# Patient Record
Sex: Female | Born: 1960 | Race: Black or African American | Hispanic: No | Marital: Single | Smoking: Never smoker
Health system: Southern US, Community
[De-identification: ages and names within clinical notes are randomized; demographics above are authoritative.]

## PROBLEM LIST (undated history)

## (undated) DIAGNOSIS — Z8489 Family history of other specified conditions: Secondary | ICD-10-CM

## (undated) DIAGNOSIS — I219 Acute myocardial infarction, unspecified: Secondary | ICD-10-CM

## (undated) DIAGNOSIS — I10 Essential (primary) hypertension: Secondary | ICD-10-CM

## (undated) DIAGNOSIS — I2510 Atherosclerotic heart disease of native coronary artery without angina pectoris: Secondary | ICD-10-CM

## (undated) DIAGNOSIS — E669 Obesity, unspecified: Secondary | ICD-10-CM

## (undated) DIAGNOSIS — I82409 Acute embolism and thrombosis of unspecified deep veins of unspecified lower extremity: Secondary | ICD-10-CM

## (undated) DIAGNOSIS — Z9289 Personal history of other medical treatment: Secondary | ICD-10-CM

## (undated) HISTORY — PX: TOTAL KNEE ARTHROPLASTY: SHX125

## (undated) HISTORY — PX: JOINT REPLACEMENT: SHX530

## (undated) HISTORY — PX: HERNIA REPAIR: SHX51

## (undated) HISTORY — PX: KNEE ARTHROSCOPY: SHX127

---

## 1980-09-19 HISTORY — PX: TUBAL LIGATION: SHX77

## 2014-09-19 DIAGNOSIS — I219 Acute myocardial infarction, unspecified: Secondary | ICD-10-CM

## 2014-09-19 HISTORY — PX: TOTAL ABDOMINAL HYSTERECTOMY: SHX209

## 2014-09-19 HISTORY — PX: CORONARY ANGIOPLASTY WITH STENT PLACEMENT: SHX49

## 2014-09-19 HISTORY — DX: Acute myocardial infarction, unspecified: I21.9

## 2016-12-29 ENCOUNTER — Emergency Department (HOSPITAL_BASED_OUTPATIENT_CLINIC_OR_DEPARTMENT_OTHER)
Admission: EM | Admit: 2016-12-29 | Discharge: 2016-12-29 | Disposition: A | Discharge status: Home / Self Care | Payer: 59 | Attending: Emergency Medicine | Admitting: Emergency Medicine

## 2016-12-29 ENCOUNTER — Encounter (HOSPITAL_BASED_OUTPATIENT_CLINIC_OR_DEPARTMENT_OTHER): Payer: Self-pay | Admitting: *Deleted

## 2016-12-29 DIAGNOSIS — I2510 Atherosclerotic heart disease of native coronary artery without angina pectoris: Secondary | ICD-10-CM | POA: Diagnosis not present

## 2016-12-29 DIAGNOSIS — R238 Other skin changes: Secondary | ICD-10-CM | POA: Diagnosis not present

## 2016-12-29 DIAGNOSIS — I10 Essential (primary) hypertension: Secondary | ICD-10-CM | POA: Insufficient documentation

## 2016-12-29 HISTORY — DX: Obesity, unspecified: E66.9

## 2016-12-29 HISTORY — DX: Essential (primary) hypertension: I10

## 2016-12-29 HISTORY — DX: Acute embolism and thrombosis of unspecified deep veins of unspecified lower extremity: I82.409

## 2016-12-29 HISTORY — DX: Atherosclerotic heart disease of native coronary artery without angina pectoris: I25.10

## 2016-12-29 MED ORDER — IBUPROFEN 400 MG PO TABS: 400.0000 mg | ORAL_TABLET | Freq: Four times a day (QID) | ORAL | 0 refills | Status: AC | PRN

## 2016-12-29 MED ORDER — IBUPROFEN 800 MG PO TABS
800.0000 mg | ORAL_TABLET | Freq: Once | ORAL | Status: AC
  Administered 2016-12-29: 800 mg via ORAL
  Filled 2016-12-29: qty 1

## 2016-12-29 MED ORDER — CEPHALEXIN 250 MG PO CAPS
250.0000 mg | ORAL_CAPSULE | Freq: Once | ORAL | Status: AC
Start: 2016-12-29 — End: 2016-12-29
  Administered 2016-12-29: 250 mg via ORAL
  Filled 2016-12-29: qty 1

## 2016-12-29 MED ORDER — CEPHALEXIN 500 MG PO CAPS: 500.0000 mg | ORAL_CAPSULE | Freq: Two times a day (BID) | ORAL | 0 refills | Status: DC

## 2016-12-29 NOTE — ED Provider Notes (Signed)
[facility] Provider Note   CSN: [id number] Arrival date & time: 12/29/16  0245     History   Chief Complaint Chief Complaint  Patient presents with  . Facial Swelling    HPI Lindsey Fisher is a 56 y.o. female.  The history is provided by the patient.  Rash   This is a new problem. The current episode started 2 days ago. The problem has been gradually worsening. The problem is associated with nothing. There has been no fever. Affected Location: just lateral to the lip. The pain is moderate. The pain has been constant since onset. Associated symptoms include pain. Pertinent negatives include no weeping. She has tried nothing for the symptoms. The treatment provided no relief. Risk factors: none.  No f/c/r. Only one lesion.  No drainage.    Past Medical History:  Diagnosis Date  . Coronary artery disease   . DVT (deep venous thrombosis) (HCC)   . Hypertension   . Obesity     There are no active problems to display for this patient.   Past Surgical History:  Procedure Laterality Date  . RADICAL HYSTERECTOMY    . STENT PLACEMENT VASCULAR ([facility] HX)      OB History    No data available       Home Medications    Prior to Admission medications   Medication Sig Start Date End Date Taking? Authorizing Provider  cephALEXin (KEFLEX) 500 MG capsule Take 1 capsule (500 mg total) by mouth 2 (two) times daily. 12/29/16   Darelle Kings, MD  ibuprofen (ADVIL,MOTRIN) 400 MG tablet Take 1 tablet (400 mg total) by mouth every 6 (six) hours as needed. 12/29/16   Wess Baney, MD    Family History No family history on file.  Social History Social History  Substance Use Topics  . Smoking status: Not on file  . Smokeless tobacco: Not on file  . Alcohol use Not on file     Allergies   Ace inhibitors   Review of Systems Review of Systems  Constitutional: Negative for fever.  HENT: Negative for sinus pain.   Gastrointestinal: Negative for constipation.    Skin: Positive for rash.  All other systems reviewed and are negative.    Physical Exam Updated Vital Signs BP (!) 190/117 (BP Location: Left Arm)   Pulse 66   Temp 98.3 F (36.8 C) (Oral)   Resp 20   Ht 5' (1.524 m)   Wt 200 lb (90.7 kg)   SpO2 98%   BMI 39.06 kg/m   Physical Exam  Constitutional: She is oriented to person, place, and time. She appears well-developed and well-nourished.  HENT:  Head: Normocephalic and atraumatic.    Mouth/Throat: No oropharyngeal exudate.  Eyes: Conjunctivae and EOM are normal. Pupils are equal, round, and reactive to light.  Neck: Normal range of motion. Neck supple. No JVD present.  Cardiovascular: Normal rate, regular rhythm, normal heart sounds and intact distal pulses.   Pulmonary/Chest: Effort normal and breath sounds normal. She has no wheezes. She has no rales.  Abdominal: Soft. Bowel sounds are normal. She exhibits no mass. There is no tenderness. There is no rebound and no guarding.  Musculoskeletal: Normal range of motion.  Lymphadenopathy:    She has no cervical adenopathy.  Neurological: She is alert and oriented to person, place, and time.  Skin: Skin is warm and dry. Capillary refill takes less than 2 seconds.  Psychiatric: She has a normal mood and affect.  ED Treatments / Results   Vitals:   12/29/16 0252  BP: (!) 190/117  Pulse: 66  Resp: 20  Temp: 98.3 F (36.8 C)    Radiology No results found.  Procedures Procedures (including critical care time)  Medications Ordered in ED Medications  cephALEXin (KEFLEX) capsule 250 mg (250 mg Oral Given 12/29/16 0504)  ibuprofen (ADVIL,MOTRIN) tablet 800 mg (800 mg Oral Given 12/29/16 0504)     Final Clinical Impressions(s) / ED Diagnoses   Final diagnoses:  Pimples   Exam and vitals are benign and reassuring. The patient is nontoxic-appearing and imaging is negative for acute injury.  Return for fevers, intractable pain, facial swelling.Take all  antibiotics.  After history, exam, and medical workup I feel the patient has been appropriately medically screened and is safe for discharge home. Pertinent diagnoses were discussed with the patient. Patient was given return precautions. New Prescriptions New Prescriptions   CEPHALEXIN (KEFLEX) 500 MG CAPSULE    Take 1 capsule (500 mg total) by mouth 2 (two) times daily.   IBUPROFEN (ADVIL,MOTRIN) 400 MG TABLET    Take 1 tablet (400 mg total) by mouth every 6 (six) hours as needed.     Cy Blamer, MD 12/29/16 [phone]

## 2016-12-29 NOTE — ED Triage Notes (Signed)
Woke yesterday am felt bump to left corner of mouth,  States it has started swelling and sending pain to left facial area and ear

## 2017-05-18 ENCOUNTER — Emergency Department (HOSPITAL_BASED_OUTPATIENT_CLINIC_OR_DEPARTMENT_OTHER): Payer: 59

## 2017-05-18 ENCOUNTER — Inpatient Hospital Stay (HOSPITAL_BASED_OUTPATIENT_CLINIC_OR_DEPARTMENT_OTHER)
Admission: EM | Admit: 2017-05-18 | Discharge: 2017-05-26 | DRG: 418 | Disposition: A | Discharge status: Home / Self Care | Payer: 59 | Attending: Internal Medicine | Admitting: Internal Medicine

## 2017-05-18 ENCOUNTER — Encounter (HOSPITAL_BASED_OUTPATIENT_CLINIC_OR_DEPARTMENT_OTHER): Payer: Self-pay | Admitting: Emergency Medicine

## 2017-05-18 DIAGNOSIS — I10 Essential (primary) hypertension: Secondary | ICD-10-CM | POA: Diagnosis present

## 2017-05-18 DIAGNOSIS — R1013 Epigastric pain: Secondary | ICD-10-CM | POA: Diagnosis not present

## 2017-05-18 DIAGNOSIS — E785 Hyperlipidemia, unspecified: Secondary | ICD-10-CM | POA: Diagnosis present

## 2017-05-18 DIAGNOSIS — K8020 Calculus of gallbladder without cholecystitis without obstruction: Secondary | ICD-10-CM | POA: Diagnosis present

## 2017-05-18 DIAGNOSIS — Z86718 Personal history of other venous thrombosis and embolism: Secondary | ICD-10-CM | POA: Diagnosis not present

## 2017-05-18 DIAGNOSIS — Z6841 Body Mass Index (BMI) 40.0 and over, adult: Secondary | ICD-10-CM

## 2017-05-18 DIAGNOSIS — Z888 Allergy status to other drugs, medicaments and biological substances status: Secondary | ICD-10-CM

## 2017-05-18 DIAGNOSIS — D649 Anemia, unspecified: Secondary | ICD-10-CM | POA: Diagnosis not present

## 2017-05-18 DIAGNOSIS — Z955 Presence of coronary angioplasty implant and graft: Secondary | ICD-10-CM

## 2017-05-18 DIAGNOSIS — E876 Hypokalemia: Secondary | ICD-10-CM | POA: Diagnosis present

## 2017-05-18 DIAGNOSIS — R112 Nausea with vomiting, unspecified: Secondary | ICD-10-CM | POA: Diagnosis present

## 2017-05-18 DIAGNOSIS — E66813 Obesity, class 3: Secondary | ICD-10-CM

## 2017-05-18 DIAGNOSIS — R0789 Other chest pain: Secondary | ICD-10-CM | POA: Diagnosis present

## 2017-05-18 DIAGNOSIS — I252 Old myocardial infarction: Secondary | ICD-10-CM

## 2017-05-18 DIAGNOSIS — I2510 Atherosclerotic heart disease of native coronary artery without angina pectoris: Secondary | ICD-10-CM | POA: Diagnosis present

## 2017-05-18 DIAGNOSIS — K8000 Calculus of gallbladder with acute cholecystitis without obstruction: Principal | ICD-10-CM | POA: Diagnosis present

## 2017-05-18 DIAGNOSIS — Z7901 Long term (current) use of anticoagulants: Secondary | ICD-10-CM

## 2017-05-18 DIAGNOSIS — K760 Fatty (change of) liver, not elsewhere classified: Secondary | ICD-10-CM | POA: Diagnosis present

## 2017-05-18 DIAGNOSIS — R7989 Other specified abnormal findings of blood chemistry: Secondary | ICD-10-CM | POA: Diagnosis present

## 2017-05-18 DIAGNOSIS — Z419 Encounter for procedure for purposes other than remedying health state, unspecified: Secondary | ICD-10-CM

## 2017-05-18 DIAGNOSIS — I2583 Coronary atherosclerosis due to lipid rich plaque: Secondary | ICD-10-CM | POA: Diagnosis not present

## 2017-05-18 DIAGNOSIS — R945 Abnormal results of liver function studies: Secondary | ICD-10-CM

## 2017-05-18 DIAGNOSIS — K5900 Constipation, unspecified: Secondary | ICD-10-CM | POA: Diagnosis not present

## 2017-05-18 DIAGNOSIS — K660 Peritoneal adhesions (postprocedural) (postinfection): Secondary | ICD-10-CM | POA: Diagnosis present

## 2017-05-18 DIAGNOSIS — Z8249 Family history of ischemic heart disease and other diseases of the circulatory system: Secondary | ICD-10-CM

## 2017-05-18 DIAGNOSIS — K219 Gastro-esophageal reflux disease without esophagitis: Secondary | ICD-10-CM | POA: Diagnosis present

## 2017-05-18 DIAGNOSIS — Z7982 Long term (current) use of aspirin: Secondary | ICD-10-CM

## 2017-05-18 DIAGNOSIS — R072 Precordial pain: Secondary | ICD-10-CM

## 2017-05-18 HISTORY — DX: Personal history of other medical treatment: Z92.89

## 2017-05-18 HISTORY — DX: Acute myocardial infarction, unspecified: I21.9

## 2017-05-18 HISTORY — DX: Family history of other specified conditions: Z84.89

## 2017-05-18 LAB — D-DIMER, QUANTITATIVE (NOT AT ARMC): D DIMER QUANT: 1.58 ug{FEU}/mL — AB (ref 0.00–0.50)

## 2017-05-18 LAB — BASIC METABOLIC PANEL
ANION GAP: 9 (ref 5–15)
BUN: 9 mg/dL (ref 6–20)
CALCIUM: 9 mg/dL (ref 8.9–10.3)
CO2: 28 mmol/L (ref 22–32)
Chloride: 103 mmol/L (ref 101–111)
Creatinine, Ser: 0.84 mg/dL (ref 0.44–1.00)
Glucose, Bld: 122 mg/dL — ABNORMAL HIGH (ref 65–99)
POTASSIUM: 3.6 mmol/L (ref 3.5–5.1)
Sodium: 140 mmol/L (ref 135–145)

## 2017-05-18 LAB — CBC
HCT: 36 % (ref 36.0–46.0)
HEMOGLOBIN: 12 g/dL (ref 12.0–15.0)
MCH: 28.8 pg (ref 26.0–34.0)
MCHC: 33.3 g/dL (ref 30.0–36.0)
MCV: 86.3 fL (ref 78.0–100.0)
Platelets: 262 10*3/uL (ref 150–400)
RBC: 4.17 MIL/uL (ref 3.87–5.11)
RDW: 14.9 % (ref 11.5–15.5)
WBC: 7.6 10*3/uL (ref 4.0–10.5)

## 2017-05-18 LAB — LIPASE, BLOOD: LIPASE: 32 U/L (ref 11–51)

## 2017-05-18 LAB — MAGNESIUM: MAGNESIUM: 2 mg/dL (ref 1.7–2.4)

## 2017-05-18 LAB — PROTIME-INR
INR: 1.1
PROTHROMBIN TIME: 14.1 s (ref 11.4–15.2)

## 2017-05-18 LAB — HEPATIC FUNCTION PANEL
ALBUMIN: 4.2 g/dL (ref 3.5–5.0)
ALT: 231 U/L — ABNORMAL HIGH (ref 14–54)
AST: 364 U/L — ABNORMAL HIGH (ref 15–41)
Alkaline Phosphatase: 165 U/L — ABNORMAL HIGH (ref 38–126)
Bilirubin, Direct: 0.7 mg/dL — ABNORMAL HIGH (ref 0.1–0.5)
Indirect Bilirubin: 0.9 mg/dL (ref 0.3–0.9)
TOTAL PROTEIN: 7.5 g/dL (ref 6.5–8.1)
Total Bilirubin: 1.6 mg/dL — ABNORMAL HIGH (ref 0.3–1.2)

## 2017-05-18 LAB — TROPONIN I
Troponin I: <0.03 ng/mL (ref <0.03)
Troponin I: <0.03 ng/mL (ref <0.03)
Troponin I: <0.03 ng/mL (ref <0.03)

## 2017-05-18 MED ORDER — HEPARIN SODIUM (PORCINE) 5000 UNIT/ML IJ SOLN
5000.0000 [IU] | Freq: Three times a day (TID) | INTRAMUSCULAR | Status: AC
  Administered 2017-05-18 – 2017-05-23 (×14): 5000 [IU] via SUBCUTANEOUS
  Filled 2017-05-18 (×14): qty 1

## 2017-05-18 MED ORDER — ATORVASTATIN CALCIUM 40 MG PO TABS
40.0000 mg | ORAL_TABLET | Freq: Every day | ORAL | Status: DC
  Administered 2017-05-19 – 2017-05-26 (×8): 40 mg via ORAL
  Filled 2017-05-18 (×8): qty 1

## 2017-05-18 MED ORDER — ALBUTEROL SULFATE HFA 108 (90 BASE) MCG/ACT IN AERS: 1.0000 | INHALATION_SPRAY | Freq: Four times a day (QID) | RESPIRATORY_TRACT | Status: DC | PRN

## 2017-05-18 MED ORDER — CLOPIDOGREL BISULFATE 75 MG PO TABS
75.0000 mg | ORAL_TABLET | Freq: Every day | ORAL | Status: DC
  Administered 2017-05-19: 75 mg via ORAL
  Filled 2017-05-18: qty 1

## 2017-05-18 MED ORDER — AMLODIPINE BESYLATE 5 MG PO TABS
5.0000 mg | ORAL_TABLET | Freq: Every day | ORAL | Status: DC
  Administered 2017-05-19 – 2017-05-26 (×8): 5 mg via ORAL
  Filled 2017-05-18 (×8): qty 1

## 2017-05-18 MED ORDER — ASPIRIN 81 MG PO CHEW
324.0000 mg | CHEWABLE_TABLET | Freq: Once | ORAL | Status: AC
  Administered 2017-05-18: 324 mg via ORAL
  Filled 2017-05-18: qty 4

## 2017-05-18 MED ORDER — SODIUM CHLORIDE 0.9 % IV SOLN
INTRAVENOUS | Status: AC
  Administered 2017-05-18: 100 mL/h via INTRAVENOUS

## 2017-05-18 MED ORDER — IOPAMIDOL (ISOVUE-370) INJECTION 76%
100.0000 mL | Freq: Once | INTRAVENOUS | Status: AC | PRN
  Administered 2017-05-18: 100 mL via INTRAVENOUS

## 2017-05-18 MED ORDER — ATENOLOL 25 MG PO TABS
25.0000 mg | ORAL_TABLET | Freq: Every day | ORAL | Status: DC
Start: 2017-05-19 — End: 2017-05-26
  Administered 2017-05-19 – 2017-05-26 (×8): 25 mg via ORAL
  Filled 2017-05-18 (×8): qty 1

## 2017-05-18 MED ORDER — FENTANYL CITRATE (PF) 100 MCG/2ML IJ SOLN
25.0000 ug | INTRAMUSCULAR | Status: DC | PRN
  Administered 2017-05-24 – 2017-05-25 (×5): 50 ug via INTRAVENOUS
  Filled 2017-05-18 (×5): qty 2

## 2017-05-18 MED ORDER — SODIUM CHLORIDE 0.9% FLUSH
3.0000 mL | Freq: Two times a day (BID) | INTRAVENOUS | Status: DC
  Administered 2017-05-18 – 2017-05-26 (×13): 3 mL via INTRAVENOUS

## 2017-05-18 MED ORDER — ACETAMINOPHEN 325 MG PO TABS
650.0000 mg | ORAL_TABLET | Freq: Four times a day (QID) | ORAL | Status: DC | PRN
Start: 2017-05-18 — End: 2017-05-23
  Administered 2017-05-19 – 2017-05-20 (×2): 650 mg via ORAL
  Filled 2017-05-18 (×2): qty 2

## 2017-05-18 MED ORDER — ONDANSETRON HCL 4 MG PO TABS
4.0000 mg | ORAL_TABLET | Freq: Four times a day (QID) | ORAL | Status: DC | PRN
  Administered 2017-05-26: 4 mg via ORAL
  Filled 2017-05-18: qty 1

## 2017-05-18 MED ORDER — ALBUTEROL SULFATE (2.5 MG/3ML) 0.083% IN NEBU: 2.5000 mg | INHALATION_SOLUTION | Freq: Four times a day (QID) | RESPIRATORY_TRACT | Status: DC | PRN

## 2017-05-18 MED ORDER — FAMOTIDINE IN NACL 20-0.9 MG/50ML-% IV SOLN
20.0000 mg | INTRAVENOUS | Status: DC
  Administered 2017-05-18 – 2017-05-24 (×7): 20 mg via INTRAVENOUS
  Filled 2017-05-18 (×7): qty 50

## 2017-05-18 MED ORDER — ONDANSETRON HCL 4 MG/2ML IJ SOLN
4.0000 mg | Freq: Four times a day (QID) | INTRAMUSCULAR | Status: DC | PRN
Start: 2017-05-18 — End: 2017-05-23

## 2017-05-18 MED ORDER — ACETAMINOPHEN 650 MG RE SUPP
650.0000 mg | Freq: Four times a day (QID) | RECTAL | Status: DC | PRN
Start: 2017-05-18 — End: 2017-05-23

## 2017-05-18 NOTE — H&P (Addendum)
History and Physical    Lindsey Fisher ZOX:[id number] DOB: 08-11-1961 DOA: 05/18/2017  PCP: Jackie Plum, MD   Patient coming from: Home, by way of [facility] ED    Chief Complaint: Chest pressure, nausea, vomiting   HPI: Lindsey Fisher is a 56 y.o. 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 Patient reports pain in her usual state of health and having an uneventful day yesterday, going out for dinner last night for fried fish. Couple hours after returning home, she noted the acute onset of central chest discomfort with nausea and recurrent nonbloody vomiting. She denies any associated fevers, chills, shortness of breath, cough, or diaphoresis. She has experienced some fleeting chest pressure in the past, but never with the severe nausea and vomiting. Symptoms persisted unchanged, prompting her presentation to the ED today.    [facility] ED Course: Upon arrival to the ED, patient is found to be afebrile, saturating well on room air, and with vital signs otherwise stable. EKG features a sinus rhythm with T-wave inversion in lead 3 and nonspecific ST abnormality in lateral leads. Chest x-ray is negative for acute cardiopulmonary disease. Chemistry panel reveals a alkaline phosphatase of 165, AST 364, ALT 231, and total bilirubin of 1.6. Lipase level normal. CBC is unremarkable. Troponin is undetectable 2 and 5 hours apart. D-dimer was elevated to 1.58 and CTAP study is negative for PE or acute cardiac pulmonary disease. Right upper quadrant ultrasound reveals a contracted gallbladder with wall thickening, stones, possible sludge, and findings possibly reflective of acute and/or chronic cholecystitis. Patient was treated with 324 mg of aspirin in the emergency department and general surgery was consulted by the ED  physician. Surgery requested a medical admission with HIDA scan for further evaluation. Patient remained hemodynamically stable in the ED, and no apparent respiratory distress, and will be observed on telemetry unit for ongoing evaluation and management of chest discomfort with nausea and vomiting, with serial troponin measurements negative and possibly secondary to gallbladder disease.  Review of Systems:  All other systems reviewed and apart from HPI, are negative.  Past Medical History:  Diagnosis Date  . Coronary artery disease   . DVT (deep venous thrombosis) (HCC) ~ 2010   LLE, "after knee OR"  . Family history of adverse reaction to anesthesia    "younger brother took awhile to wakeup" (05/18/2017)  . History of blood transfusion    "related to C-section"  . Hypertension   . Myocardial infarction (HCC) 2016   "mild; after hysterectomy"  . Obesity     Past Surgical History:  Procedure Laterality Date  . CESAREAN SECTION  1982; 1985  . CORONARY ANGIOPLASTY WITH STENT PLACEMENT  2016  . HERNIA REPAIR    . JOINT REPLACEMENT    . KNEE ARTHROSCOPY Left ~ 2008  . TOTAL ABDOMINAL HYSTERECTOMY  2016  . TOTAL KNEE ARTHROPLASTY Left ~ 2010  . TUBAL LIGATION  1982     reports that she has never smoked. She has never used smokeless tobacco. She reports that she does not drink alcohol or use drugs.  Allergies  Allergen Reactions  . Ace Inhibitors     Family History  Problem Relation Age of Onset  . Hypertension Mother      Prior to Admission medications   Medication Sig Start Date End Date Taking? Authorizing Provider  albuterol (  PROVENTIL HFA;VENTOLIN HFA) 108 (90 Base) MCG/ACT inhaler Inhale into the lungs every 6 (six) hours as needed for wheezing or shortness of breath.   Yes [provider]  amLODipine (NORVASC) 5 MG tablet Take 5 mg by mouth daily.   Yes [provider]  atenolol (TENORMIN) 25 MG tablet Take by mouth daily.   Yes [provider]  atorvastatin (LIPITOR) 40 MG tablet Take 40 mg by mouth at bedtime.    Yes [provider]  clopidogrel (PLAVIX) 75 MG tablet Take 75 mg by mouth daily. 05/17/17  Yes [provider]  hydrochlorothiazide (HYDRODIURIL) 25 MG tablet Take 25 mg by mouth daily.   Yes [provider]  ibuprofen (ADVIL,MOTRIN) 400 MG tablet Take 1 tablet (400 mg total) by mouth every 6 (six) hours as needed. 12/29/16  Yes Palumbo, April, MD    Physical Exam: Vitals:   05/18/17 1430 05/18/17 1600 05/18/17 1755 05/18/17 2002  BP: (!) 143/69 130/74 (!) 143/69 132/68  Pulse: (!) 57 (!) 48 (!) 59 (!) 51  Resp: 19 14  16   Temp:   98.1 F (36.7 C) 98.3 F (36.8 C)  TempSrc:   Oral Oral  SpO2: 99% 97% 100% 98%  Weight:      Height:          Constitutional: NAD, calm, obese Eyes: PERTLA, lids and conjunctivae normal ENMT: Mucous membranes are moist. Posterior pharynx clear of any exudate or lesions.   Neck: normal, supple, no masses, no thyromegaly Respiratory: clear to auscultation bilaterally, no wheezing, no crackles. Normal respiratory effort.  Cardiovascular: S1 & S2 heard, regular rate and rhythm. No extremity edema. No significant JVD. Abdomen: No distension, soft, mild tenderness in epigastrium and RUQ without rebound pain or guarding. Bowel sounds normal.  Musculoskeletal: no clubbing / cyanosis. No joint deformity upper and lower extremities.   Skin: no significant rashes, lesions, ulcers. Warm, dry, well-perfused. Neurologic: CN 2-12 grossly intact. Sensation intact, DTR normal. Strength 5/5 in all 4 limbs.  Psychiatric: Alert and oriented x 3. Calm, cooperative.     Labs on Admission: I have personally reviewed following labs and imaging studies  CBC:  Recent Labs Lab 05/18/17 0924  WBC 7.6  HGB 12.0  HCT 36.0  MCV 86.3  PLT 262   Basic Metabolic Panel:  Recent Labs Lab 05/18/17 0924  NA 140  K 3.6  CL 103  CO2 28  GLUCOSE 122*  BUN 9  CREATININE  0.84  CALCIUM 9.0  MG 2.0   GFR: Estimated Creatinine Clearance: 77 mL/min (by C-G formula based on SCr of 0.84 mg/dL). Liver Function Tests:  Recent Labs Lab 05/18/17 0924  AST 364*  ALT 231*  ALKPHOS 165*  BILITOT 1.6*  PROT 7.5  ALBUMIN 4.2    Recent Labs Lab 05/18/17 0924  LIPASE 32   No results for input(s): AMMONIA in the last 168 hours. Coagulation Profile:  Recent Labs Lab 05/18/17 0924  INR 1.10   Cardiac Enzymes:  Recent Labs Lab 05/18/17 0924 05/18/17 1419 05/18/17 2003  TROPONINI <0.03 <0.03 <0.03   BNP (last 3 results) No results for input(s): PROBNP in the last 8760 hours. HbA1C: No results for input(s): HGBA1C in the last 72 hours. CBG: No results for input(s): GLUCAP in the last 168 hours. Lipid Profile: No results for input(s): CHOL, HDL, LDLCALC, TRIG, CHOLHDL, LDLDIRECT in the last 72 hours. Thyroid Function Tests: No results for input(s): TSH, T4TOTAL, FREET4, T3FREE, THYROIDAB in the last 72  hours. Anemia Panel: No results for input(s): VITAMINB12, FOLATE, FERRITIN, TIBC, IRON, RETICCTPCT in the last 72 hours. Urine analysis: No results found for: COLORURINE, APPEARANCEUR, LABSPEC, PHURINE, GLUCOSEU, HGBUR, BILIRUBINUR, KETONESUR, PROTEINUR, UROBILINOGEN, NITRITE, LEUKOCYTESUR Sepsis Labs: @LABRCNTIP (procalcitonin:4,lacticidven:4) )No results found for this or any previous visit (from the past 240 hour(s)).   Radiological Exams on Admission: Dg Chest 2 View  Result Date: 05/18/2017 CLINICAL DATA:  56 year old female with heart palpitations and vomiting with centralized chest pressure. EXAM: CHEST  2 VIEW COMPARISON:  None. FINDINGS: The heart size and mediastinal contours are within normal limits. Both lungs are clear. The visualized skeletal structures are unremarkable. IMPRESSION: No active cardiopulmonary disease. Electronically Signed   By: Sande Brothers M.D.   On: 05/18/2017 09:39   Ct Angio Chest Pe W And/or Wo  Contrast  Result Date: 05/18/2017 CLINICAL DATA:  Heart palpitations, central chest pressure. Positive D-dimer. EXAM: CT ANGIOGRAPHY CHEST WITH CONTRAST TECHNIQUE: Multidetector CT imaging of the chest was performed using the standard protocol during bolus administration of intravenous contrast. Multiplanar CT image reconstructions and MIPs were obtained to evaluate the vascular anatomy. CONTRAST:  100 cc Isovue 370 IV COMPARISON:  Chest x-ray today. FINDINGS: Cardiovascular: Coronary artery calcifications in the left anterior descending coronary artery. Heart is normal size. Aorta is normal caliber. No filling defects in the pulmonary arteries to suggest pulmonary emboli. Mediastinum/Nodes: No mediastinal, hilar, or axillary adenopathy. Trachea and esophagus are unremarkable. Thyroid unremarkable. Lungs/Pleura: Lungs are clear. No focal airspace opacities or suspicious nodules. No effusions. Upper Abdomen: Imaging into the upper abdomen shows no acute findings. Musculoskeletal: Chest wall soft tissues are unremarkable. No acute bony abnormality. Review of the MIP images confirms the above findings. IMPRESSION: No evidence of pulmonary embolus. Coronary artery calcifications in the left anterior descending coronary artery. No acute cardiopulmonary disease. Electronically Signed   By: Charlett Nose M.D.   On: 05/18/2017 10:52   US Abdomen Limited Ruq  Result Date: 05/18/2017 CLINICAL DATA:  Patient with epigastric pain and vomiting. EXAM: ULTRASOUND ABDOMEN LIMITED RIGHT UPPER QUADRANT COMPARISON:  None. FINDINGS: Gallbladder: Gallbladder is contracted with wall thickening measuring up to 5 mm. Stones are demonstrated within the gallbladder lumen. Possible sludge. Common bile duct: Diameter: 4 mm Liver: Liver is increased in echogenicity. No focal lesion is identified. Portal vein is patent on color Doppler imaging with normal direction of blood flow towards the liver. IMPRESSION: Gallbladder is contracted with  associated wall thickening. Stones and possible sludge within the gallbladder lumen. Findings are nonspecific however may represent acute and/or chronic cholecystitis. Recommend clinical/ laboratory correlation. HIDA scan could be performed as clinically indicated. Hepatic steatosis. Electronically Signed   By: Annia Belt M.D.   On: 05/18/2017 11:46    EKG: Independently reviewed. Sinus rhythm, T-wave inversion in lead III and non-specific ST-abnormality laterally. No priors available.   Assessment/Plan  1. Chest discomfort, CAD  - Pt presented to ED with chest discomfort, nausea, and vomiting  - She has known CAD with stent placed in 2016  - Initial cardiac eval is reassuring with undetectable troponin x2  - EKG has non-specific ST-T abnormalities with no priors available  - D-dimer elevated, but CTA chest negative for PE or any other acute cardiopulmonary process  - Low suspicion for ACS, possibly GI-related given N/V   - Continue Plavix, Lipitor, atenolol   2. Cholelithiasis, ?cholecystitis   - Pt presents with chest discomfort and N/V after eating a fried fish dinner  - There is no leukocytosis  or fever; transaminases modestly elevated and total bilirubin slightly elevated, lipase wnl  - RUQ US demonstrates GB wall-thickening with stones, possible sludge, and possibly reflective of acute and/or chronic cholecystitis  - Surgery is consulting and much appreciated  - HIDA scan ordered, continue supportive care with bowel-rest, IVF hydration, antiemetics and analgesia    3. Hypertension  - BP at goal  - Continue atenolol and Norvasc as tolerated  - Hold HCTZ while hydrating    4. Hx of DVT, elevated d-dimer  - Pt had a lower extremity DVT following knee replacement  - D-dimer elevated in ED to 1.58, followed CTA chest negative for PE  - There is no leg swelling or tenderness, no palpable cord, low-suspicion for recurrent DVT    5. Elevated LFT's - AST 364, ALT 231, and total  bilirubin 1.6 with normal lipase  - RUQ US demonstrates hepatic steatosis and gallbladder findings discussed above  - No prior LFT's to compare  - Repeat CMP in am    DVT prophylaxis: sq heparin  Code Status: Full Family Communication: Discussed with patient Disposition Plan: Observe on telemetry Consults called: Gen surgery Admission status: Observation    Briscoe Deutscher, MD [facility] Pager [phone]  If 7PM-7AM, please contact night-coverage [email or web address] Password [email or web address]  05/19/2017, 2:20 AM

## 2017-05-18 NOTE — Plan of Care (Signed)
PMHx: Obesity, hypertension, DVT, CAD  CC: epigastric and CP  ED Course: labs showed abn LFTs, U/S concerning for acute GB  Gen Surg: wants a HIDA not convinced pt has acute GB, cardiac clearance due to cardiac hx if surgery needed  Status: tele, inpt  Haydee Salter MD

## 2017-05-18 NOTE — ED Notes (Signed)
Patient transported to Ultrasound 

## 2017-05-18 NOTE — ED Notes (Signed)
Attempted to call report x 1  

## 2017-05-18 NOTE — ED Provider Notes (Signed)
[place] [facility] Provider Note   CSN: [id number] Arrival date & time: 05/18/17  5284     History   Chief Complaint Chief Complaint  Patient presents with  . Palpitations    HPI Shams Fill is a 56 y.o. female.  The history is provided by the patient.  Chest Pain   This is a new problem. The current episode started 12 to 24 hours ago. The problem occurs constantly. The problem has not changed since onset.The pain is associated with breathing. The pain is present in the substernal region. The pain is at a severity of 6/10. The pain is moderate. The quality of the pain is described as heavy and pressure-like. The pain does not radiate. Duration of episode(s) is 9 hours. The symptoms are aggravated by deep breathing. Associated symptoms include diaphoresis, nausea, palpitations and vomiting. Pertinent negatives include no abdominal pain, no back pain, no cough, no dizziness, no exertional chest pressure, no fever, no headaches, no hemoptysis, no leg pain, no lower extremity edema, no near-syncope, no shortness of breath, no sputum production, no syncope and no weakness. She has tried nothing for the symptoms. The treatment provided no relief.  Her past medical history is significant for CAD and DVT (prior).  Pertinent negatives for past medical history include no COPD, no CHF, no PE and no recent injury.    Past Medical History:  Diagnosis Date  . Coronary artery disease   . DVT (deep venous thrombosis) ([place])   . Hypertension   . Obesity     There are no active problems to display for this patient.   Past Surgical History:  Procedure Laterality Date  . RADICAL HYSTERECTOMY    . STENT PLACEMENT VASCULAR ([place] [facility])      OB History    No data available       Home Medications    Prior to Admission medications   Medication Sig Start Date End Date Taking? Authorizing Provider  cephALEXin (KEFLEX) 500 MG capsule Take 1 capsule (500 mg total) by mouth 2 (two) times  daily. 12/29/16   Palumbo, April, MD  ibuprofen (ADVIL,MOTRIN) 400 MG tablet Take 1 tablet (400 mg total) by mouth every 6 (six) hours as needed. 12/29/16   Palumbo, April, MD    Family History No family history on file.  Social History Social History  Substance Use Topics  . Smoking status: Not on file  . Smokeless tobacco: Not on file  . Alcohol use Not on file     Allergies   Ace inhibitors   Review of Systems Review of Systems  Constitutional: Positive for diaphoresis. Negative for appetite change, chills, fatigue and fever.  HENT: Negative for congestion and rhinorrhea.   Respiratory: Negative for cough, hemoptysis, sputum production, chest tightness, shortness of breath, wheezing and stridor.   Cardiovascular: Positive for chest pain and palpitations. Negative for leg swelling, syncope and near-syncope.  Gastrointestinal: Positive for nausea and vomiting. Negative for abdominal pain, constipation and diarrhea.  Genitourinary: Negative for dysuria.  Musculoskeletal: Negative for back pain, neck pain and neck stiffness.  Skin: Negative for rash and wound.  Neurological: Positive for light-headedness. Negative for dizziness, syncope, weakness and headaches.  All other systems reviewed and are negative.    Physical Exam Updated Vital Signs BP 132/68 (BP Location: Left Arm)   Pulse (!) 51   Temp 98.3 F (36.8 C) (Oral)   Resp 16   Ht 5' 1"  (1.549 m)   Wt 91.2 kg (201 lb)  SpO2 98%   BMI 37.98 kg/m   Physical Exam  Constitutional: She is oriented to person, place, and time. She appears well-developed and well-nourished. No distress.  HENT:  Head: Normocephalic.  Mouth/Throat: Oropharynx is clear and moist. No oropharyngeal exudate.  Eyes: Pupils are equal, round, and reactive to light. Conjunctivae and EOM are normal.  Neck: Normal range of motion.  Cardiovascular: Normal rate and intact distal pulses.   Murmur heard. Pulmonary/Chest: Effort normal and breath  sounds normal. No stridor. No respiratory distress. She has no wheezes. She has no rales. She exhibits tenderness.    Abdominal: Soft. Bowel sounds are normal. She exhibits no distension. There is no tenderness. There is no guarding.  Musculoskeletal: She exhibits no edema or tenderness.  Neurological: She is alert and oriented to person, place, and time. No sensory deficit. She exhibits normal muscle tone.  Skin: Capillary refill takes less than 2 seconds. No rash noted. She is not diaphoretic. No erythema.  Psychiatric: She has a normal mood and affect.  Nursing note and vitals reviewed.    ED Treatments / Results  Labs (all labs ordered are listed, but only abnormal results are displayed) Labs Reviewed  BASIC METABOLIC PANEL - Abnormal; Notable for the following:       Result Value   Glucose, Bld 122 (*)    All other components within normal limits  D-DIMER, QUANTITATIVE (NOT AT [facility]) - Abnormal; Notable for the following:    D-Dimer, Quant 1.58 (*)    All other components within normal limits  HEPATIC FUNCTION PANEL - Abnormal; Notable for the following:    AST 364 (*)    ALT 231 (*)    Alkaline Phosphatase 165 (*)    Total Bilirubin 1.6 (*)    Bilirubin, Direct 0.7 (*)    All other components within normal limits  CBC  TROPONIN I  PROTIME-INR  LIPASE, BLOOD  MAGNESIUM  TROPONIN I  HIV ANTIBODY (ROUTINE TESTING)  CBC WITH DIFFERENTIAL/PLATELET  COMPREHENSIVE METABOLIC PANEL  LIPASE, BLOOD  TROPONIN I  TROPONIN I    EKG  EKG Interpretation  Date/Time:  Thursday May 18 2017 09:08:11 EDT Ventricular Rate:  74 PR Interval:    QRS Duration: 106 QT Interval:  405 QTC Calculation: 450 R Axis:   13 Text Interpretation:  Sinus rhythm Left atrial enlargement No prior ECG for comparison.  T wave inversion in lead 3.  No STEMI Confirmed by Antony Blackbird [phone]) on 05/18/2017 9:11:16 AM Also confirmed by Antony Blackbird [phone]), editor Philomena Doheny [phone])  on  05/18/2017 9:22:44 AM       Radiology Dg Chest 2 View  Result Date: 05/18/2017 CLINICAL DATA:  56 year old female with heart palpitations and vomiting with centralized chest pressure. EXAM: CHEST  2 VIEW COMPARISON:  None. FINDINGS: The heart size and mediastinal contours are within normal limits. Both lungs are clear. The visualized skeletal structures are unremarkable. IMPRESSION: No active cardiopulmonary disease. Electronically Signed   By: Kristopher Oppenheim M.D.   On: 05/18/2017 09:39   Ct Angio Chest Pe W And/or Wo Contrast  Result Date: 05/18/2017 CLINICAL DATA:  Heart palpitations, central chest pressure. Positive D-dimer. EXAM: CT ANGIOGRAPHY CHEST WITH CONTRAST TECHNIQUE: Multidetector CT imaging of the chest was performed using the standard protocol during bolus administration of intravenous contrast. Multiplanar CT image reconstructions and MIPs were obtained to evaluate the vascular anatomy. CONTRAST:  100 cc Isovue 370 IV COMPARISON:  Chest x-ray today. FINDINGS: Cardiovascular: Coronary artery calcifications in the  left anterior descending coronary artery. Heart is normal size. Aorta is normal caliber. No filling defects in the pulmonary arteries to suggest pulmonary emboli. Mediastinum/Nodes: No mediastinal, hilar, or axillary adenopathy. Trachea and esophagus are unremarkable. Thyroid unremarkable. Lungs/Pleura: Lungs are clear. No focal airspace opacities or suspicious nodules. No effusions. Upper Abdomen: Imaging into the upper abdomen shows no acute findings. Musculoskeletal: Chest wall soft tissues are unremarkable. No acute bony abnormality. Review of the MIP images confirms the above findings. IMPRESSION: No evidence of pulmonary embolus. Coronary artery calcifications in the left anterior descending coronary artery. No acute cardiopulmonary disease. Electronically Signed   By: Rolm Baptise M.D.   On: 05/18/2017 10:52   US Abdomen Limited Ruq  Result Date: 05/18/2017 CLINICAL DATA:   Patient with epigastric pain and vomiting. EXAM: ULTRASOUND ABDOMEN LIMITED RIGHT UPPER QUADRANT COMPARISON:  None. FINDINGS: Gallbladder: Gallbladder is contracted with wall thickening measuring up to 5 mm. Stones are demonstrated within the gallbladder lumen. Possible sludge. Common bile duct: Diameter: 4 mm Liver: Liver is increased in echogenicity. No focal lesion is identified. Portal vein is patent on color Doppler imaging with normal direction of blood flow towards the liver. IMPRESSION: Gallbladder is contracted with associated wall thickening. Stones and possible sludge within the gallbladder lumen. Findings are nonspecific however may represent acute and/or chronic cholecystitis. Recommend clinical/ laboratory correlation. HIDA scan could be performed as clinically indicated. Hepatic steatosis. Electronically Signed   By: Lovey Newcomer M.D.   On: 05/18/2017 11:46    Procedures Procedures (including critical care time)  Medications Ordered in ED Medications  amLODipine (NORVASC) tablet 5 mg (not administered)  atenolol (TENORMIN) tablet 25 mg (not administered)  atorvastatin (LIPITOR) tablet 40 mg (not administered)  clopidogrel (PLAVIX) tablet 75 mg (not administered)  heparin injection 5,000 Units (not administered)  sodium chloride flush (NS) 0.9 % injection 3 mL (not administered)  0.9 %  sodium chloride infusion (not administered)  acetaminophen (TYLENOL) tablet 650 mg (not administered)    Or  acetaminophen (TYLENOL) suppository 650 mg (not administered)  ondansetron (ZOFRAN) tablet 4 mg (not administered)    Or  ondansetron (ZOFRAN) injection 4 mg (not administered)  fentaNYL (SUBLIMAZE) injection 25-50 mcg (not administered)  famotidine (PEPCID) IVPB 20 mg premix (not administered)  albuterol (PROVENTIL) (2.5 MG/3ML) 0.083% nebulizer solution 2.5 mg (not administered)  aspirin chewable tablet 324 mg (324 mg Oral Given 05/18/17 0934)  iopamidol (ISOVUE-370) 76 % injection 100  mL (100 mLs Intravenous Contrast Given 05/18/17 1045)     Initial Impression / Assessment and Plan / ED Course  I have reviewed the triage vital signs and the nursing notes.  Pertinent labs & imaging results that were available during my care of the patient were reviewed by me and considered in my medical decision making (see chart for details).     Nashonda Limberg is a 56 y.o. female with a past medical history significant for CAD with PCI 3, hypertension, prior DVT, and obesity who presents with chest pain. Patient reports that she was awoken last night at approximately midnight with severe chest pain. She describes the pain as pressure-like. She denies radiation. She reported associated palpitations, diaphoresis, nausea, and vomiting. She reports that she has had multiple episodes of nonbloody emesis today. She reports feeling lightheaded but no syncopal episodes. She reports that she has no shortness of breath or recent traumatic injuries. Of note, she does report that she recently flew back from [place] last month on vacation. She says that she  has not been on anticoagulation for her DVT for over one year. She denies leg pain or leg swelling.   She reports that her chest discomfort with the pressure feels similar to when she had her MI needing multiple cardiac stents. She says that she has been out of her aspirin recently. She denies any infectious symptoms such as fevers, chills, cough, diarrhea, dysuria, or any urinary symptoms. She denies rashes.    On exam, patient's lungs are clear. Chest is slightly tender to palpation in the central and left anterior chest. Patient had symmetric pulses in upper and lower extremities. Normal sensation throughout. Normal strength. Patient had no back tenderness or CVA tenderness. No significant lower extremity edema or tenderness appreciated.  Initial EKG showed T-wave inversion in lead 3 however there are no prior EKGs for comparison.  Patient will be  given aspirin and have workup to look for etiology of chest pain. Initial considerations are cardiac etiology. Patient will have d-dimer given her history of DVT, recent long flight, and absence of anticoagulation. She does say that she has had no symptoms of leg troubles or chest trouble since her trip.  Anticipate reassessment following diagnostic testing to determine disposition. Suspect patient will require admission for high risk chest pain.  Diagnostic testing showed concern for gallbladder disease with elevated LFT's and alk phos. D dimer was elevated, CTPE study ordered showing no pulmonary embolism. Right upper quadrant ultrasound ordered showing concern for gallbladder wall thickening in sludge/stones.  General surgery was called who recommended admission to hospitalist service for further management of high risk chest pain and they will see the patient in consultation. They did not recommend antibiotics at this time.  Patient admitted to hospitalist service in stable condition.    Final Clinical Impressions(s) / ED Diagnoses   Final diagnoses:  Epigastric pain  Precordial chest pain  Elevated LFTs  Non-intractable vomiting with nausea, unspecified vomiting type    Clinical Impression: 1. Precordial chest pain   2. Epigastric pain   3. Elevated LFTs   4. Non-intractable vomiting with nausea, unspecified vomiting type     Disposition: Admit to Hosspitalist service    Tegeler, Gwenyth Allegra, MD 05/18/17 2010

## 2017-05-18 NOTE — ED Notes (Signed)
Patient transported to X-ray 

## 2017-05-18 NOTE — Progress Notes (Signed)
Patient stated that she is missing her home bottle of plavix.  Marla NT spoke with Arline Asp RN (at [facility]) she stated she never saw the patient's home medication bottles.  Will notify security.

## 2017-05-18 NOTE — ED Notes (Signed)
ED Provider at bedside. 

## 2017-05-18 NOTE — ED Triage Notes (Addendum)
Pt reports heart palpitations and vomiting since last night with centralized chest pressure. Pt with recent long air travel at the end of July.

## 2017-05-19 ENCOUNTER — Observation Stay (HOSPITAL_COMMUNITY): Payer: 59

## 2017-05-19 ENCOUNTER — Encounter (HOSPITAL_COMMUNITY): Payer: Self-pay | Admitting: Family Medicine

## 2017-05-19 DIAGNOSIS — I25118 Atherosclerotic heart disease of native coronary artery with other forms of angina pectoris: Secondary | ICD-10-CM | POA: Diagnosis not present

## 2017-05-19 DIAGNOSIS — K8000 Calculus of gallbladder with acute cholecystitis without obstruction: Secondary | ICD-10-CM | POA: Diagnosis not present

## 2017-05-19 DIAGNOSIS — R0789 Other chest pain: Secondary | ICD-10-CM | POA: Diagnosis not present

## 2017-05-19 DIAGNOSIS — I10 Essential (primary) hypertension: Secondary | ICD-10-CM | POA: Diagnosis not present

## 2017-05-19 DIAGNOSIS — R7989 Other specified abnormal findings of blood chemistry: Secondary | ICD-10-CM | POA: Diagnosis not present

## 2017-05-19 DIAGNOSIS — R112 Nausea with vomiting, unspecified: Secondary | ICD-10-CM | POA: Diagnosis not present

## 2017-05-19 LAB — COMPREHENSIVE METABOLIC PANEL
ALK PHOS: 212 U/L — AB (ref 38–126)
ALT: 254 U/L — AB (ref 14–54)
ANION GAP: 9 (ref 5–15)
AST: 205 U/L — ABNORMAL HIGH (ref 15–41)
Albumin: 3.7 g/dL (ref 3.5–5.0)
BILIRUBIN TOTAL: 1.8 mg/dL — AB (ref 0.3–1.2)
BUN: 6 mg/dL (ref 6–20)
CALCIUM: 8.7 mg/dL — AB (ref 8.9–10.3)
CO2: 26 mmol/L (ref 22–32)
CREATININE: 0.83 mg/dL (ref 0.44–1.00)
Chloride: 105 mmol/L (ref 101–111)
GFR calc Af Amer: >60 mL/min (ref >60)
GFR calc non Af Amer: >60 mL/min (ref >60)
Glucose, Bld: 99 mg/dL (ref 65–99)
Potassium: 2.9 mmol/L — ABNORMAL LOW (ref 3.5–5.1)
Sodium: 140 mmol/L (ref 135–145)
TOTAL PROTEIN: 6.7 g/dL (ref 6.5–8.1)

## 2017-05-19 LAB — BASIC METABOLIC PANEL
ANION GAP: 6 (ref 5–15)
BUN: 6 mg/dL (ref 6–20)
CHLORIDE: 106 mmol/L (ref 101–111)
CO2: 30 mmol/L (ref 22–32)
Calcium: 8.9 mg/dL (ref 8.9–10.3)
Creatinine, Ser: 0.77 mg/dL (ref 0.44–1.00)
GFR calc Af Amer: >60 mL/min (ref >60)
GLUCOSE: 88 mg/dL (ref 65–99)
POTASSIUM: 3.6 mmol/L (ref 3.5–5.1)
Sodium: 142 mmol/L (ref 135–145)

## 2017-05-19 LAB — LIPASE, BLOOD: Lipase: 30 U/L (ref 11–51)

## 2017-05-19 LAB — CBC WITH DIFFERENTIAL/PLATELET
BASOS PCT: 0 %
Basophils Absolute: 0 10*3/uL (ref 0.0–0.1)
EOS PCT: 1 %
Eosinophils Absolute: 0 10*3/uL (ref 0.0–0.7)
HEMATOCRIT: 33.8 % — AB (ref 36.0–46.0)
HEMOGLOBIN: 11 g/dL — AB (ref 12.0–15.0)
Lymphocytes Relative: 18 %
Lymphs Abs: 1.1 10*3/uL (ref 0.7–4.0)
MCH: 28.3 pg (ref 26.0–34.0)
MCHC: 32.5 g/dL (ref 30.0–36.0)
MCV: 86.9 fL (ref 78.0–100.0)
MONO ABS: 0.3 10*3/uL (ref 0.1–1.0)
MONOS PCT: 4 %
Neutro Abs: 4.8 10*3/uL (ref 1.7–7.7)
Neutrophils Relative %: 77 %
Platelets: 237 10*3/uL (ref 150–400)
RBC: 3.89 MIL/uL (ref 3.87–5.11)
RDW: 15.3 % (ref 11.5–15.5)
WBC: 6.2 10*3/uL (ref 4.0–10.5)

## 2017-05-19 LAB — TROPONIN I: Troponin I: <0.03 ng/mL (ref <0.03)

## 2017-05-19 LAB — GLUCOSE, CAPILLARY: GLUCOSE-CAPILLARY: 72 mg/dL (ref 65–99)

## 2017-05-19 MED ORDER — POTASSIUM CHLORIDE 10 MEQ/100ML IV SOLN
10.0000 meq | INTRAVENOUS | Status: AC
  Administered 2017-05-19 (×3): 10 meq via INTRAVENOUS
  Filled 2017-05-19 (×3): qty 100

## 2017-05-19 MED ORDER — CLOPIDOGREL BISULFATE 75 MG PO TABS
75.0000 mg | ORAL_TABLET | Freq: Every day | ORAL | Status: DC
Start: 2017-05-24 — End: 2017-05-22

## 2017-05-19 MED ORDER — TECHNETIUM TC 99M MEBROFENIN IV KIT
5.0000 | PACK | Freq: Once | INTRAVENOUS | Status: AC | PRN
  Administered 2017-05-19: 5 via INTRAVENOUS

## 2017-05-19 MED ORDER — PIPERACILLIN-TAZOBACTAM 3.375 G IVPB
3.3750 g | Freq: Three times a day (TID) | INTRAVENOUS | Status: DC
  Administered 2017-05-19 – 2017-05-24 (×13): 3.375 g via INTRAVENOUS
  Filled 2017-05-19 (×18): qty 50

## 2017-05-19 NOTE — Consult Note (Signed)
[facility] Surgery Consult Note  Lindsey Fisher 05-04-1961  [id number].    Requesting MD: Karleen Hampshire Chief Complaint/Reason for Consult: Possible Acute cholecystitis HPI:  Patient is a 56 y/o female who presented to [facility] with chest pain, nausea and vomiting. Reports she also felt pain in her upper abdomen, pain was sharp and lasted about 4 hrs. She reports one similar episode that was less severe about 1 month ago. Reports she also experienced fever >101 at home, diaphoresis. Denied palpitations, SOB, urinary sxs, diarrhea.  Cardiac workup was negative for acute cardiopulmonary disease. RUQ US showed possible acute and/or chronic cholecystitis. Patient had a HIDA scan today and reported no pain with that. Currently patient has only mild RUQ pain and denies nausea or vomiting.  PMH significant for HTN, CAD with Hx of MI, stent x3 (2016), Hx of DVT (2012) after knee surgery.  Past abdominal surgeries: 2 cesarean sections, abdominal total hysterectomy Patient takes plavix 75 mg daily at home, last dose 8/31; also on heparin SQ q8h  ROS: Review of Systems  Constitutional: Positive for diaphoresis and fever. Negative for chills.  Respiratory: Negative for shortness of breath and wheezing.   Cardiovascular: Positive for chest pain. Negative for palpitations.  Gastrointestinal: Positive for abdominal pain, nausea and vomiting. Negative for blood in stool and diarrhea.  Genitourinary: Negative for dysuria, frequency and urgency.  All other systems reviewed and are negative.   Family History  Problem Relation Age of Onset  . Hypertension Mother     Past Medical History:  Diagnosis Date  . Coronary artery disease   . DVT (deep venous thrombosis) ([place]) ~ 2010   LLE, "after knee OR"  . Family history of adverse reaction to anesthesia    "younger brother took awhile to wakeup" (05/18/2017)  . History of blood transfusion    "related to C-section"  . Hypertension   . Myocardial infarction  ([place]) 2016   "mild; after hysterectomy"  . Obesity     Past Surgical History:  Procedure Laterality Date  . CESAREAN SECTION  1982; 1985  . CORONARY ANGIOPLASTY WITH STENT PLACEMENT  2016  . HERNIA REPAIR    . JOINT REPLACEMENT    . KNEE ARTHROSCOPY Left ~ 2008  . TOTAL ABDOMINAL HYSTERECTOMY  2016  . TOTAL KNEE ARTHROPLASTY Left ~ 2010  . TUBAL LIGATION  1982    Social History:  reports that she has never smoked. She has never used smokeless tobacco. She reports that she does not drink alcohol or use drugs.  Allergies:  Allergies  Allergen Reactions  . Ace Inhibitors     Medications Prior to Admission  Medication Sig Dispense Refill  . albuterol (PROVENTIL HFA;VENTOLIN HFA) 108 (90 Base) MCG/ACT inhaler Inhale into the lungs every 6 (six) hours as needed for wheezing or shortness of breath.    Marland Kitchen amLODipine (NORVASC) 5 MG tablet Take 5 mg by mouth daily.    Marland Kitchen atenolol (TENORMIN) 25 MG tablet Take by mouth daily.    Marland Kitchen atorvastatin (LIPITOR) 40 MG tablet Take 40 mg by mouth at bedtime.     . clopidogrel (PLAVIX) 75 MG tablet Take 75 mg by mouth daily.    . hydrochlorothiazide (HYDRODIURIL) 25 MG tablet Take 25 mg by mouth daily.    Marland Kitchen ibuprofen (ADVIL,MOTRIN) 400 MG tablet Take 1 tablet (400 mg total) by mouth every 6 (six) hours as needed. 30 tablet 0  . [DISCONTINUED] cephALEXin (KEFLEX) 500 MG capsule Take 1 capsule (500 mg total) by mouth 2 (  two) times daily. 15 capsule 0    Blood pressure 125/90, pulse (!) 56, temperature 98.1 F (36.7 C), temperature source Oral, resp. rate 17, height 5\' 1"  (1.549 m), weight 97.5 kg (214 lb 14.4 oz), SpO2 100 %. Physical Exam: Physical Exam  Constitutional: She is oriented to person, place, and time. She appears well-developed and well-nourished. She is cooperative.  Non-toxic appearance. No distress.  HENT:  Head: Normocephalic and atraumatic.  Right Ear: External ear normal.  Left Ear: External ear normal.  Nose: Nose normal.   Mouth/Throat: Oropharynx is clear and moist and mucous membranes are normal.  Eyes: Pupils are equal, round, and reactive to light. Conjunctivae and lids are normal. No scleral icterus.  Neck: Normal range of motion. Neck supple. No thyromegaly present.  Cardiovascular: Regular rhythm.  Bradycardia present.   Pulses:      Radial pulses are 2+ on the right side, and 2+ on the left side.       Dorsalis pedis pulses are 2+ on the right side, and 2+ on the left side.  Pulmonary/Chest: Effort normal and breath sounds normal.  Abdominal: Soft. Normal appearance and bowel sounds are normal. She exhibits no distension. There is no hepatosplenomegaly. There is tenderness in the right upper quadrant. There is positive Murphy's sign. There is no rigidity, no rebound and no guarding. No hernia.  Musculoskeletal:  ROM intact in bilateral upper and lower extremities  Neurological: She is alert and oriented to person, place, and time. She has normal strength. No sensory deficit.  Skin: Skin is warm, dry and intact. No rash noted. She is not diaphoretic. No pallor.  Psychiatric: She has a normal mood and affect. Her speech is normal and behavior is normal.    Results for orders placed or performed during the hospital encounter of 05/18/17 (from the past 48 hour(s))  Basic metabolic panel     Status: Abnormal   Collection Time: 05/18/17  9:24 AM  Result Value Ref Range   Sodium 140 135 - 145 mmol/L   Potassium 3.6 3.5 - 5.1 mmol/L   Chloride 103 101 - 111 mmol/L   CO2 28 22 - 32 mmol/L   Glucose, Bld 122 (H) 65 - 99 mg/dL   BUN 9 6 - 20 mg/dL   Creatinine, Ser 05/20/17 0.44 - 1.00 mg/dL   Calcium 9.0 8.9 - 3.98 mg/dL   GFR calc non Af Amer >60 >60 mL/min   GFR calc Af Amer >60 >60 mL/min    Comment: (NOTE) The eGFR has been calculated using the CKD EPI equation. This calculation has not been validated in all clinical situations. eGFR's persistently <60 mL/min signify possible Chronic Kidney Disease.     Anion gap 9 5 - 15  CBC     Status: None   Collection Time: 05/18/17  9:24 AM  Result Value Ref Range   WBC 7.6 4.0 - 10.5 K/uL   RBC 4.17 3.87 - 5.11 MIL/uL   Hemoglobin 12.0 12.0 - 15.0 g/dL   HCT 05/20/17 03.3 - 08.9 %   MCV 86.3 78.0 - 100.0 fL   MCH 28.8 26.0 - 34.0 pg   MCHC 33.3 30.0 - 36.0 g/dL   RDW 97.1 67.4 - 93.2 %   Platelets 262 150 - 400 K/uL  Troponin I     Status: None   Collection Time: 05/18/17  9:24 AM  Result Value Ref Range   Troponin I <0.03 <0.03 ng/mL  Protime-INR     Status: None  Collection Time: 05/18/17  9:24 AM  Result Value Ref Range   Prothrombin Time 14.1 11.4 - 15.2 seconds   INR 1.10   D-dimer, quantitative (not at [facility])     Status: Abnormal   Collection Time: 05/18/17  9:24 AM  Result Value Ref Range   D-Dimer, Quant 1.58 (H) 0.00 - 0.50 ug/mL-FEU    Comment: (NOTE) At the manufacturer cut-off of 0.50 ug/mL FEU, this assay has been documented to exclude PE with a sensitivity and negative predictive value of 97 to 99%.  At this time, this assay has not been approved by the FDA to exclude DVT/VTE. Results should be correlated with clinical presentation.   Hepatic function panel     Status: Abnormal   Collection Time: 05/18/17  9:24 AM  Result Value Ref Range   Total Protein 7.5 6.5 - 8.1 g/dL   Albumin 4.2 3.5 - 5.0 g/dL   AST 364 (H) 15 - 41 U/L   ALT 231 (H) 14 - 54 U/L   Alkaline Phosphatase 165 (H) 38 - 126 U/L   Total Bilirubin 1.6 (H) 0.3 - 1.2 mg/dL   Bilirubin, Direct 0.7 (H) 0.1 - 0.5 mg/dL   Indirect Bilirubin 0.9 0.3 - 0.9 mg/dL  Lipase, blood     Status: None   Collection Time: 05/18/17  9:24 AM  Result Value Ref Range   Lipase 32 11 - 51 U/L  Magnesium     Status: None   Collection Time: 05/18/17  9:24 AM  Result Value Ref Range   Magnesium 2.0 1.7 - 2.4 mg/dL  Troponin I     Status: None   Collection Time: 05/18/17  2:19 PM  Result Value Ref Range   Troponin I <0.03 <0.03 ng/mL  Troponin I (q 6hr x 3)     Status:  None   Collection Time: 05/18/17  8:03 PM  Result Value Ref Range   Troponin I <0.03 <0.03 ng/mL  CBC WITH DIFFERENTIAL     Status: Abnormal   Collection Time: 05/19/17  1:46 AM  Result Value Ref Range   WBC 6.2 4.0 - 10.5 K/uL   RBC 3.89 3.87 - 5.11 MIL/uL   Hemoglobin 11.0 (L) 12.0 - 15.0 g/dL   HCT 33.8 (L) 36.0 - 46.0 %   MCV 86.9 78.0 - 100.0 fL   MCH 28.3 26.0 - 34.0 pg   MCHC 32.5 30.0 - 36.0 g/dL   RDW 15.3 11.5 - 15.5 %   Platelets 237 150 - 400 K/uL   Neutrophils Relative % 77 %   Neutro Abs 4.8 1.7 - 7.7 K/uL   Lymphocytes Relative 18 %   Lymphs Abs 1.1 0.7 - 4.0 K/uL   Monocytes Relative 4 %   Monocytes Absolute 0.3 0.1 - 1.0 K/uL   Eosinophils Relative 1 %   Eosinophils Absolute 0.0 0.0 - 0.7 K/uL   Basophils Relative 0 %   Basophils Absolute 0.0 0.0 - 0.1 K/uL  Comprehensive metabolic panel     Status: Abnormal   Collection Time: 05/19/17  1:46 AM  Result Value Ref Range   Sodium 140 135 - 145 mmol/L   Potassium 2.9 (L) 3.5 - 5.1 mmol/L   Chloride 105 101 - 111 mmol/L   CO2 26 22 - 32 mmol/L   Glucose, Bld 99 65 - 99 mg/dL   BUN 6 6 - 20 mg/dL   Creatinine, Ser 0.83 0.44 - 1.00 mg/dL   Calcium 8.7 (L) 8.9 - 10.3 mg/dL  Total Protein 6.7 6.5 - 8.1 g/dL   Albumin 3.7 3.5 - 5.0 g/dL   AST 205 (H) 15 - 41 U/L   ALT 254 (H) 14 - 54 U/L   Alkaline Phosphatase 212 (H) 38 - 126 U/L   Total Bilirubin 1.8 (H) 0.3 - 1.2 mg/dL   GFR calc non Af Amer >60 >60 mL/min   GFR calc Af Amer >60 >60 mL/min    Comment: (NOTE) The eGFR has been calculated using the CKD EPI equation. This calculation has not been validated in all clinical situations. eGFR's persistently <60 mL/min signify possible Chronic Kidney Disease.    Anion gap 9 5 - 15  Lipase, blood     Status: None   Collection Time: 05/19/17  1:46 AM  Result Value Ref Range   Lipase 30 11 - 51 U/L  Troponin I (q 6hr x 3)     Status: None   Collection Time: 05/19/17  1:46 AM  Result Value Ref Range    Troponin I <0.03 <0.03 ng/mL  Glucose, capillary     Status: None   Collection Time: 05/19/17 10:44 AM  Result Value Ref Range   Glucose-Capillary 72 65 - 99 mg/dL  Basic metabolic panel     Status: None   Collection Time: 05/19/17  1:21 PM  Result Value Ref Range   Sodium 142 135 - 145 mmol/L   Potassium 3.6 3.5 - 5.1 mmol/L   Chloride 106 101 - 111 mmol/L   CO2 30 22 - 32 mmol/L   Glucose, Bld 88 65 - 99 mg/dL   BUN 6 6 - 20 mg/dL   Creatinine, Ser 0.77 0.44 - 1.00 mg/dL   Calcium 8.9 8.9 - 10.3 mg/dL   GFR calc non Af Amer >60 >60 mL/min   GFR calc Af Amer >60 >60 mL/min    Comment: (NOTE) The eGFR has been calculated using the CKD EPI equation. This calculation has not been validated in all clinical situations. eGFR's persistently <60 mL/min signify possible Chronic Kidney Disease.    Anion gap 6 5 - 15   Dg Chest 2 View  Result Date: 05/18/2017 CLINICAL DATA:  56 year old female with heart palpitations and vomiting with centralized chest pressure. EXAM: CHEST  2 VIEW COMPARISON:  None. FINDINGS: The heart size and mediastinal contours are within normal limits. Both lungs are clear. The visualized skeletal structures are unremarkable. IMPRESSION: No active cardiopulmonary disease. Electronically Signed   By: Kristopher Oppenheim M.D.   On: 05/18/2017 09:39   Ct Angio Chest Pe W And/or Wo Contrast  Result Date: 05/18/2017 CLINICAL DATA:  Heart palpitations, central chest pressure. Positive D-dimer. EXAM: CT ANGIOGRAPHY CHEST WITH CONTRAST TECHNIQUE: Multidetector CT imaging of the chest was performed using the standard protocol during bolus administration of intravenous contrast. Multiplanar CT image reconstructions and MIPs were obtained to evaluate the vascular anatomy. CONTRAST:  100 cc Isovue 370 IV COMPARISON:  Chest x-ray today. FINDINGS: Cardiovascular: Coronary artery calcifications in the left anterior descending coronary artery. Heart is normal size. Aorta is normal caliber. No  filling defects in the pulmonary arteries to suggest pulmonary emboli. Mediastinum/Nodes: No mediastinal, hilar, or axillary adenopathy. Trachea and esophagus are unremarkable. Thyroid unremarkable. Lungs/Pleura: Lungs are clear. No focal airspace opacities or suspicious nodules. No effusions. Upper Abdomen: Imaging into the upper abdomen shows no acute findings. Musculoskeletal: Chest wall soft tissues are unremarkable. No acute bony abnormality. Review of the MIP images confirms the above findings. IMPRESSION: No evidence of pulmonary  embolus. Coronary artery calcifications in the left anterior descending coronary artery. No acute cardiopulmonary disease. Electronically Signed   By: Rolm Baptise M.D.   On: 05/18/2017 10:52   US Abdomen Limited Ruq  Result Date: 05/18/2017 CLINICAL DATA:  Patient with epigastric pain and vomiting. EXAM: ULTRASOUND ABDOMEN LIMITED RIGHT UPPER QUADRANT COMPARISON:  None. FINDINGS: Gallbladder: Gallbladder is contracted with wall thickening measuring up to 5 mm. Stones are demonstrated within the gallbladder lumen. Possible sludge. Common bile duct: Diameter: 4 mm Liver: Liver is increased in echogenicity. No focal lesion is identified. Portal vein is patent on color Doppler imaging with normal direction of blood flow towards the liver. IMPRESSION: Gallbladder is contracted with associated wall thickening. Stones and possible sludge within the gallbladder lumen. Findings are nonspecific however may represent acute and/or chronic cholecystitis. Recommend clinical/ laboratory correlation. HIDA scan could be performed as clinically indicated. Hepatic steatosis. Electronically Signed   By: Lovey Newcomer M.D.   On: 05/18/2017 11:46      Assessment/Plan RUQ pain/Possible Acute Cholecystitis vs chronic - Gallbladder is contracted with associated wall thickening. Stones and possible sludge within the gallbladder lumen. Findings are nonspecific however may represent acute and/or  chronic cholecystitis - HIDA pending - WBC 6.2 and patient is afebrile - Alk Phos elevated 212 and trending up, AST/ALT elevated, Tbili 1.8 trending up - this is concerning for possible choledocholithiasis, recommend GI consult for possible MRCP/ERCP  HTN CAD/Hx of MI/stent x3 - patient would need cardiac clearance prior to surgery Obesity   FEN - Ok to have clears for now from surgery standpoint VTE - SCDs, heparin, hold plavix  ID- Zosyn (8/31>>)  Plan: Hold plavix. Get cardiac clearance. Consider GI consult for mild transaminitis possible choledocholithiasis. MD to see and make further recommendations.  Brigid Re, [facility] Surgery 05/19/2017, 4:03 PM Pager: [phone] Consults: [phone] Mon-Fri 7:00 am-4:30 pm Sat-Sun 7:00 am-11:30 am

## 2017-05-19 NOTE — Progress Notes (Signed)
PROGRESS NOTE    Lindsey Fisher  ZOX:[id number] DOB: [birth year] DOA: 05/18/2017 PCP: Jackie Plum, MD    Brief Narrative: Lindsey Fisher is a 56 y.o. 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 Surgery requested a medical admission with HIDA scan for further evaluation. D-dimer was elevated to 1.58 and CTAP study is negative for PE or acute cardiac pulmonary disease. Right upper quadrant ultrasound reveals a contracted gallbladder with wall thickening, stones, possible sludge, and findings possibly reflective of acute and/or chronic cholecystitis.   Assessment & Plan:   Principal Problem:   Chest discomfort Active Problems:   Elevated LFTs   Cholelithiasis   CAD (coronary artery disease)   Personal history of DVT (deep vein thrombosis)   Essential hypertension   Nausea & vomiting   Substernal pain :  Resolved.  ? GERD.    Acute on chronic cholecystitis:  Surgery consulted, started on zosyn.  HIDA scan ordered.  Symptomatic management with iv zofran.   Hypertension:  Well controlled.    Hyperlipidemia:  Resume Lipitor.   Hypokalemia replaced.    DVT prophylaxis: (sq heparin)  Code Status: full code.  Family Communication: none at bedside.  Disposition Plan: pending further evaluation by surgery.   Consultants:   Surgery.    Procedures: none.   Antimicrobials: zosyn started.   Subjective: Reports abd pain radiating up.  Nausea and vomiting improved.  Objective: Vitals:   05/18/17 1755 05/18/17 2002 05/19/17 0602 05/19/17 0844  BP: (!) 143/69 132/68 (!) 150/82 130/72  Pulse: (!) 59 (!) 51 63   Resp:  16 17   Temp: 98.1 F (36.7 C) 98.3 F (36.8 C) 98.1 F (36.7 C)   TempSrc: Oral Oral Oral   SpO2: 100% 98% 100%   Weight:   97.5 kg (214 lb 14.4 oz)   Height:         Intake/Output Summary (Last 24 hours) at 05/19/17 1302 Last data filed at 05/19/17 0800  Gross per 24 hour  Intake                3 ml  Output                0 ml  Net                3 ml   Filed Weights   05/18/17 0912 05/19/17 0602  Weight: 91.2 kg (201 lb) 97.5 kg (214 lb 14.4 oz)    Examination:  General exam: Appears calm and comfortable  Respiratory system: Clear to auscultation. Respiratory effort normal. Cardiovascular system: S1 & S2 heard, RRR. No JVD, murmurs, rubs, gallops or clicks. No pedal edema. Gastrointestinal system: Abdomen is SOFT TENDER IN THE RUQ, MORE when compared to the rest of the abdomen.  Central nervous system: Alert and oriented. No focal neurological deficits. Extremities: Symmetric 5 x 5 power. Skin: No rashes, lesions or ulcers Psychiatry: Judgement and insight appear normal. Mood & affect appropriate.     Data Reviewed: I have personally reviewed following labs and imaging studies  CBC:  Recent Labs Lab 05/18/17 0924 05/19/17 0146  WBC 7.6 6.2  NEUTROABS  --  4.8  HGB 12.0 11.0*  HCT 36.0 33.8*  MCV 86.3 86.9  PLT 262 237   Basic Metabolic Panel:  Recent Labs Lab 05/18/17 0924 05/19/17 0146  NA 140 140  K 3.6 2.9*  CL 103 105  CO2 28 26  GLUCOSE 122* 99  BUN 9 6  CREATININE 0.84 0.83  CALCIUM 9.0 8.7*  MG 2.0  --    GFR: Estimated Creatinine Clearance: 80.9 mL/min (by C-G formula based on SCr of 0.83 mg/dL). Liver Function Tests:  Recent Labs Lab 05/18/17 0924 05/19/17 0146  AST 364* 205*  ALT 231* 254*  ALKPHOS 165* 212*  BILITOT 1.6* 1.8*  PROT 7.5 6.7  ALBUMIN 4.2 3.7    Recent Labs Lab 05/18/17 0924 05/19/17 0146  LIPASE 32 30   No results for input(s): AMMONIA in the last 168 hours. Coagulation Profile:  Recent Labs Lab 05/18/17 0924  INR 1.10   Cardiac Enzymes:  Recent Labs Lab 05/18/17 0924 05/18/17 1419 05/18/17 2003 05/19/17 0146  TROPONINI <0.03 <0.03 <0.03 <0.03   BNP  (last 3 results) No results for input(s): PROBNP in the last 8760 hours. HbA1C: No results for input(s): HGBA1C in the last 72 hours. CBG:  Recent Labs Lab 05/19/17 1044  GLUCAP 72   Lipid Profile: No results for input(s): CHOL, HDL, LDLCALC, TRIG, CHOLHDL, LDLDIRECT in the last 72 hours. Thyroid Function Tests: No results for input(s): TSH, T4TOTAL, FREET4, T3FREE, THYROIDAB in the last 72 hours. Anemia Panel: No results for input(s): VITAMINB12, FOLATE, FERRITIN, TIBC, IRON, RETICCTPCT in the last 72 hours. Sepsis Labs: No results for input(s): PROCALCITON, LATICACIDVEN in the last 168 hours.  No results found for this or any previous visit (from the past 240 hour(s)).       Radiology Studies: Dg Chest 2 View  Result Date: 05/18/2017 CLINICAL DATA:  56 year old female with heart palpitations and vomiting with centralized chest pressure. EXAM: CHEST  2 VIEW COMPARISON:  None. FINDINGS: The heart size and mediastinal contours are within normal limits. Both lungs are clear. The visualized skeletal structures are unremarkable. IMPRESSION: No active cardiopulmonary disease. Electronically Signed   By: Sande Brothers M.D.   On: 05/18/2017 09:39   Ct Angio Chest Pe W And/or Wo Contrast  Result Date: 05/18/2017 CLINICAL DATA:  Heart palpitations, central chest pressure. Positive D-dimer. EXAM: CT ANGIOGRAPHY CHEST WITH CONTRAST TECHNIQUE: Multidetector CT imaging of the chest was performed using the standard protocol during bolus administration of intravenous contrast. Multiplanar CT image reconstructions and MIPs were obtained to evaluate the vascular anatomy. CONTRAST:  100 cc Isovue 370 IV COMPARISON:  Chest x-ray today. FINDINGS: Cardiovascular: Coronary artery calcifications in the left anterior descending coronary artery. Heart is normal size. Aorta is normal caliber. No filling defects in the pulmonary arteries to suggest pulmonary emboli. Mediastinum/Nodes: No mediastinal, hilar,  or axillary adenopathy. Trachea and esophagus are unremarkable. Thyroid unremarkable. Lungs/Pleura: Lungs are clear. No focal airspace opacities or suspicious nodules. No effusions. Upper Abdomen: Imaging into the upper abdomen shows no acute findings. Musculoskeletal: Chest wall soft tissues are unremarkable. No acute bony abnormality. Review of the MIP images confirms the above findings. IMPRESSION: No evidence of pulmonary embolus. Coronary artery calcifications in the left anterior descending coronary artery. No acute cardiopulmonary disease. Electronically Signed   By: Charlett Nose M.D.   On: 05/18/2017 10:52   US Abdomen Limited Ruq  Result Date: 05/18/2017 CLINICAL DATA:  Patient with epigastric pain and vomiting. EXAM: ULTRASOUND ABDOMEN LIMITED RIGHT UPPER QUADRANT COMPARISON:  None. FINDINGS: Gallbladder: Gallbladder is contracted with wall thickening measuring up to 5 mm. Stones are demonstrated within the gallbladder lumen. Possible sludge. Common bile duct: Diameter: 4 mm Liver: Liver is increased  in echogenicity. No focal lesion is identified. Portal vein is patent on color Doppler imaging with normal direction of blood flow towards the liver. IMPRESSION: Gallbladder is contracted with associated wall thickening. Stones and possible sludge within the gallbladder lumen. Findings are nonspecific however may represent acute and/or chronic cholecystitis. Recommend clinical/ laboratory correlation. HIDA scan could be performed as clinically indicated. Hepatic steatosis. Electronically Signed   By: Annia Belt M.D.   On: 05/18/2017 11:46        Scheduled Meds: . amLODipine  5 mg Oral Daily  . atenolol  25 mg Oral Daily  . atorvastatin  40 mg Oral q1800  . clopidogrel  75 mg Oral Daily  . heparin  5,000 Units Subcutaneous Q8H  . sodium chloride flush  3 mL Intravenous Q12H   Continuous Infusions: . famotidine (PEPCID) IV Stopped (05/18/17 2119)     LOS: 0 days    Time spent: 35  MINUTES.     Kathlen Mody, MD [facility] Pager [phone]  If 7PM-7AM, please contact night-coverage [email or web address] Password [email or web address] 05/19/2017, 1:02 PM

## 2017-05-19 NOTE — Progress Notes (Signed)
ANTIBIOTIC CONSULT NOTE - INITIAL  Pharmacy Consult for Zosyn Indication: Cholecystitis  Allergies  Allergen Reactions  . Ace Inhibitors     Patient Measurements: Height: 5\' 1"  (154.9 cm) Weight: 214 lb 14.4 oz (97.5 kg) IBW/kg (Calculated) : 47.8 Adjusted Body Weight:    Vital Signs: Temp: 98.1 F (36.7 C) (08/31 0602) Temp Source: Oral (08/31 0602) BP: 130/72 (08/31 0844) Pulse Rate: 63 (08/31 0602) Intake/Output from previous day: 08/30 0701 - 08/31 0700 In: 3 [I.V.:3] Out: -  Intake/Output from this shift: No intake/output data recorded.  Labs:  Recent Labs  05/18/17 0924 05/19/17 0146  WBC 7.6 6.2  HGB 12.0 11.0*  PLT 262 237  CREATININE 0.84 0.83   Estimated Creatinine Clearance: 80.9 mL/min (by C-G formula based on SCr of 0.83 mg/dL). No results for input(s): VANCOTROUGH, VANCOPEAK, VANCORANDOM, GENTTROUGH, GENTPEAK, GENTRANDOM, TOBRATROUGH, TOBRAPEAK, TOBRARND, AMIKACINPEAK, AMIKACINTROU, AMIKACIN in the last 72 hours.   Microbiology: No results found for this or any previous visit (from the past 720 hour(s)).  Medical History: Past Medical History:  Diagnosis Date  . Coronary artery disease   . DVT (deep venous thrombosis) (HCC) ~ 2010   LLE, "after knee OR"  . Family history of adverse reaction to anesthesia    "younger brother took awhile to wakeup" (05/18/2017)  . History of blood transfusion    "related to C-section"  . Hypertension   . Myocardial infarction (HCC) 2016   "mild; after hysterectomy"  . Obesity     Assessment:  CC/HPI: Chest pressure, N/V after fried fish meal. alkaline phosphatase of 165, AST 364>205 today, ALT 231>254 today, and total bilirubin of 1.6>1.8 today. Lipase level normal. D-dimer was elevated to 1.58 and CTAP study is negative for PE or acute cardiac pulmonary disease. CBC ok. Scr 0.83 (CrCl 80), K 2.9 low  - Right upper quadrant ultrasound reveals a contracted gallbladder with wall thickening, stones, possible  sludge, and findings possibly reflective of acute and/or chronic cholecystitis.   PMH: obesity, hypertension, coronary disease with stent, and history of DVT after knee replacement  Goal of Therapy:  Eradication of infection  Plan:  Zosyn 3.375g IV q8hr Pharmacy will sign off. Please reconsult for further dosing assitance.    Issaac Shipper S. Merilynn Finland, PharmD, BCPS Clinical Staff Pharmacist Pager [phone]  Misty Stanley Stillinger 05/19/2017,1:26 PM

## 2017-05-19 NOTE — Consult Note (Deleted)
    Review of Systems   Blood pressure 125/90, pulse (!) 56, temperature 98.1 F (36.7 C), temperature source Oral, resp. rate 17, height 5\' 1"  (1.549 m), weight 97.5 kg (214 lb 14.4 oz), SpO2 100 %. Physical Exam  Constitutional: She is oriented to person, place, and time.  Respiratory: Effort normal and breath sounds normal.  GI: There is tenderness in the right upper quadrant.  Musculoskeletal: Normal range of motion.  Neurological: She is alert and oriented to person, place, and time.  Skin: Skin is warm and dry.  Psychiatric: She has a normal mood and affect. Her behavior is normal.

## 2017-05-19 NOTE — Care Management (Signed)
1130 05-19-17 Tomi Bamberger, RN, BSN [phone] CM received consult for PCP- CM did provide pt with the Health Connect Number. Patient to call to find a provider in network with the insurance. No further needs from CM at this time.

## 2017-05-20 ENCOUNTER — Observation Stay (HOSPITAL_BASED_OUTPATIENT_CLINIC_OR_DEPARTMENT_OTHER): Payer: 59

## 2017-05-20 DIAGNOSIS — I25118 Atherosclerotic heart disease of native coronary artery with other forms of angina pectoris: Secondary | ICD-10-CM | POA: Diagnosis not present

## 2017-05-20 DIAGNOSIS — K660 Peritoneal adhesions (postprocedural) (postinfection): Secondary | ICD-10-CM | POA: Diagnosis present

## 2017-05-20 DIAGNOSIS — Z7901 Long term (current) use of anticoagulants: Secondary | ICD-10-CM | POA: Diagnosis not present

## 2017-05-20 DIAGNOSIS — I2583 Coronary atherosclerosis due to lipid rich plaque: Secondary | ICD-10-CM | POA: Diagnosis not present

## 2017-05-20 DIAGNOSIS — E876 Hypokalemia: Secondary | ICD-10-CM | POA: Diagnosis present

## 2017-05-20 DIAGNOSIS — I252 Old myocardial infarction: Secondary | ICD-10-CM | POA: Diagnosis not present

## 2017-05-20 DIAGNOSIS — Z8249 Family history of ischemic heart disease and other diseases of the circulatory system: Secondary | ICD-10-CM | POA: Diagnosis not present

## 2017-05-20 DIAGNOSIS — I10 Essential (primary) hypertension: Secondary | ICD-10-CM | POA: Diagnosis present

## 2017-05-20 DIAGNOSIS — R112 Nausea with vomiting, unspecified: Secondary | ICD-10-CM | POA: Diagnosis not present

## 2017-05-20 DIAGNOSIS — K760 Fatty (change of) liver, not elsewhere classified: Secondary | ICD-10-CM | POA: Diagnosis present

## 2017-05-20 DIAGNOSIS — R0789 Other chest pain: Secondary | ICD-10-CM | POA: Diagnosis not present

## 2017-05-20 DIAGNOSIS — K5900 Constipation, unspecified: Secondary | ICD-10-CM | POA: Diagnosis not present

## 2017-05-20 DIAGNOSIS — D649 Anemia, unspecified: Secondary | ICD-10-CM | POA: Diagnosis not present

## 2017-05-20 DIAGNOSIS — K219 Gastro-esophageal reflux disease without esophagitis: Secondary | ICD-10-CM | POA: Diagnosis present

## 2017-05-20 DIAGNOSIS — Z6841 Body Mass Index (BMI) 40.0 and over, adult: Secondary | ICD-10-CM | POA: Diagnosis not present

## 2017-05-20 DIAGNOSIS — R1013 Epigastric pain: Secondary | ICD-10-CM | POA: Diagnosis present

## 2017-05-20 DIAGNOSIS — Z955 Presence of coronary angioplasty implant and graft: Secondary | ICD-10-CM | POA: Diagnosis not present

## 2017-05-20 DIAGNOSIS — R7989 Other specified abnormal findings of blood chemistry: Secondary | ICD-10-CM | POA: Diagnosis not present

## 2017-05-20 DIAGNOSIS — Z888 Allergy status to other drugs, medicaments and biological substances status: Secondary | ICD-10-CM | POA: Diagnosis not present

## 2017-05-20 DIAGNOSIS — E785 Hyperlipidemia, unspecified: Secondary | ICD-10-CM | POA: Diagnosis present

## 2017-05-20 DIAGNOSIS — Z86718 Personal history of other venous thrombosis and embolism: Secondary | ICD-10-CM | POA: Diagnosis not present

## 2017-05-20 DIAGNOSIS — Z7982 Long term (current) use of aspirin: Secondary | ICD-10-CM | POA: Diagnosis not present

## 2017-05-20 DIAGNOSIS — I2510 Atherosclerotic heart disease of native coronary artery without angina pectoris: Secondary | ICD-10-CM

## 2017-05-20 DIAGNOSIS — K8000 Calculus of gallbladder with acute cholecystitis without obstruction: Secondary | ICD-10-CM | POA: Diagnosis present

## 2017-05-20 DIAGNOSIS — I25119 Atherosclerotic heart disease of native coronary artery with unspecified angina pectoris: Secondary | ICD-10-CM | POA: Diagnosis not present

## 2017-05-20 LAB — HEPATIC FUNCTION PANEL
ALBUMIN: 3.3 g/dL — AB (ref 3.5–5.0)
ALK PHOS: 177 U/L — AB (ref 38–126)
ALT: 156 U/L — AB (ref 14–54)
AST: 67 U/L — ABNORMAL HIGH (ref 15–41)
BILIRUBIN TOTAL: 1.3 mg/dL — AB (ref 0.3–1.2)
Bilirubin, Direct: 0.3 mg/dL (ref 0.1–0.5)
Indirect Bilirubin: 1 mg/dL — ABNORMAL HIGH (ref 0.3–0.9)
TOTAL PROTEIN: 6.3 g/dL — AB (ref 6.5–8.1)

## 2017-05-20 LAB — BASIC METABOLIC PANEL
Anion gap: 9 (ref 5–15)
BUN: 7 mg/dL (ref 6–20)
CHLORIDE: 106 mmol/L (ref 101–111)
CO2: 24 mmol/L (ref 22–32)
Calcium: 8.6 mg/dL — ABNORMAL LOW (ref 8.9–10.3)
Creatinine, Ser: 0.88 mg/dL (ref 0.44–1.00)
GFR calc Af Amer: >60 mL/min (ref >60)
GFR calc non Af Amer: >60 mL/min (ref >60)
Glucose, Bld: 93 mg/dL (ref 65–99)
POTASSIUM: 3.3 mmol/L — AB (ref 3.5–5.1)
SODIUM: 139 mmol/L (ref 135–145)

## 2017-05-20 LAB — ECHOCARDIOGRAM COMPLETE
Height: 61 in
Weight: 3446.4 oz

## 2017-05-20 LAB — GLUCOSE, CAPILLARY: Glucose-Capillary: 95 mg/dL (ref 65–99)

## 2017-05-20 MED ORDER — POTASSIUM CHLORIDE CRYS ER 20 MEQ PO TBCR
40.0000 meq | EXTENDED_RELEASE_TABLET | Freq: Once | ORAL | Status: AC
  Administered 2017-05-20: 40 meq via ORAL
  Filled 2017-05-20: qty 2

## 2017-05-20 MED ORDER — PERFLUTREN LIPID MICROSPHERE
1.0000 mL | INTRAVENOUS | Status: AC | PRN
  Administered 2017-05-20: 3 mL via INTRAVENOUS
  Filled 2017-05-20: qty 10

## 2017-05-20 NOTE — Progress Notes (Signed)
  Echocardiogram 2D Echocardiogram with definity has been performed.  Leta Jungling M 05/20/2017, 3:04 PM

## 2017-05-20 NOTE — Consult Note (Signed)
Cardiology Consultation:   Patient ID: Lindsey Fisher; [id number]; 1961-04-10   Admit date: 05/18/2017 Date of Consult: 05/20/2017  Primary Care Provider: Jackie Plum, MD Primary Cardiologist: new, Dr. Tresa Endo    Patient Profile:   Lindsey Fisher is a 56 y.o. female with a hx of CAD who is being seen today for the preoperative cardiology evaluation at the request of Dr. Lindie Spruce prior to undergoing gallbladder surgery for acute cholecystitis.  History of Present Illness:   Lindsey Fisher unaware of any known cardiac history until December 2015 when 3 days following a hysterectomy apparently suffered a small myocardial infarction which Fisher diagnosed at [facility] in [place], [place].  He subsequent Fisher referred to cardiology and on 10/09/2014 underwent catheterization and coronary intervention with placement of a Xience Alpine 4.018 mm DES stent in Lindsey Fisher mid LAD.  At the time.  Lindsey Fisher states that Lindsey Fisher had a tight blockage in Lindsey Fisher vessel Fisher not totally occluded.  In April wasn't 16.  Lindsey Fisher moved to [place] from [place].  Lindsey Fisher is not yet establish cardiology care in [place].  Lindsey Fisher has experienced 2 episodes of chest pressure over the past year with one in June, leading Lindsey Fisher to establish with a primary care physician.  Lindsey Fisher has not had any subsequent cardiac testing since Lindsey Fisher stent placement.  Lindsey Fisher denied any classic symptoms of exertional chest pain or exertional dyspnea.  Lindsey Fisher recently had developed episodes of chest and epigastric discomfort associated with nausea and vomiting prompting [facility] [place] [facility] evaluation.  Troponins were negative.  LFTs were significantly increased and Lindsey Fisher Fisher felt to have possible acute cholecystitis.  Lindsey Fisher Fisher admitted to [facility].  Lindsey Fisher has been seen by Dr. Lindie Spruce of general surgery with plans for gallbladder surgery.  The patient has been on aspirin and Plavix since Lindsey Fisher stent placement and Lindsey Fisher has taken Lindsey Fisher last dose of  Plavix yesterday, 05/19/2017.  Lindsey Fisher has been on atorvastatin since Lindsey Fisher CAD documentation and also had been on amlodipine,  low-dose atenolol as well as HCTZ.  Cardiology consultation is no requested prior to potential cholecystectomy surgery.  Past Medical History:  Diagnosis Date  . Coronary artery disease   . DVT (deep venous thrombosis) (HCC) ~ 2010   LLE, "after knee OR"  . Family history of adverse reaction to anesthesia    "younger brother took awhile to wakeup" (05/18/2017)  . History of blood transfusion    "related to C-section"  . Hypertension   . Myocardial infarction (HCC) 2016   "mild; after hysterectomy"  . Obesity     Past Surgical History:  Procedure Laterality Date  . CESAREAN SECTION  1982; 1985  . CORONARY ANGIOPLASTY WITH STENT PLACEMENT  2016  . HERNIA REPAIR    . JOINT REPLACEMENT    . KNEE ARTHROSCOPY Left ~ 2008  . TOTAL ABDOMINAL HYSTERECTOMY  2016  . TOTAL KNEE ARTHROPLASTY Left ~ 2010  . TUBAL LIGATION  1982     Home Medications:  Prior to Admission medications   Medication Sig Start Date End Date Taking? Authorizing Provider  albuterol (PROVENTIL HFA;VENTOLIN HFA) 108 (90 Base) MCG/ACT inhaler Inhale into the lungs every 6 (six) hours as needed for wheezing or shortness of breath.   Yes [provider]  amLODipine (NORVASC) 5 MG tablet Take 5 mg by mouth daily.   Yes [provider]  atenolol (TENORMIN) 25 MG tablet Take by mouth daily.   Yes [provider]  atorvastatin (LIPITOR)  40 MG tablet Take 40 mg by mouth at bedtime.    Yes [provider]  clopidogrel (PLAVIX) 75 MG tablet Take 75 mg by mouth daily. 05/17/17  Yes [provider]  hydrochlorothiazide (HYDRODIURIL) 25 MG tablet Take 25 mg by mouth daily.   Yes [provider]  ibuprofen (ADVIL,MOTRIN) 400 MG tablet Take 1 tablet (400 mg total) by mouth every 6 (six) hours as needed. 12/29/16  Yes Palumbo, April, MD    Inpatient  Medications: Scheduled Meds: . amLODipine  5 mg Oral Daily  . atenolol  25 mg Oral Daily  . atorvastatin  40 mg Oral q1800  . [START ON 05/24/2017] clopidogrel  75 mg Oral Daily  . heparin  5,000 Units Subcutaneous Q8H  . sodium chloride flush  3 mL Intravenous Q12H   Continuous Infusions: . famotidine (PEPCID) IV Stopped (05/19/17 2155)  . piperacillin-tazobactam (ZOSYN)  IV Stopped (05/20/17 0946)   PRN Meds: acetaminophen **OR** acetaminophen, albuterol, fentaNYL (SUBLIMAZE) injection, ondansetron **OR** ondansetron (ZOFRAN) IV  Allergies:    Allergies  Allergen Reactions  . Ace Inhibitors     Social History:   Social History   Social History  . Marital status: Single    Spouse name: N/A  . Number of children: N/A  . Years of education: N/A   Occupational History  . Not on file.   Social History Main Topics  . Smoking status: Never Smoker  . Smokeless tobacco: Never Used  . Alcohol use No  . Drug use: No  . Sexual activity: No   Other Topics Concern  . Not on file   Social History Narrative  . No narrative on file    Additional social history is notable in that Lindsey Fisher Fisher born in [place].  Lindsey Fisher father Fisher in the [employer].  Lindsey Fisher parents were divorced.  Lindsey Fisher lived in the [place] area .  After Lindsey Fisher family had moved there and Lindsey Fisher stayed there after Lindsey Fisher parents divorced.  Lindsey Fisher has 5 biological siblings and 2 adopted brothers.  Lindsey Fisher youngest brother Fisher murdered.  Lindsey Fisher moved to [place] in April 2017 to be closer to Lindsey Fisher autistic brother who lives in [place] with a sister.  Family History:    Family History  Problem Relation Age of Onset  . Hypertension Mother     Lindsey Fisher mother is deceased.  Lindsey Fisher father lives in [place] and is a retired Arts development officer.   ROS:  General: Negative; No fevers, chills, or night sweats; positive for obesity HEENT: Negative; No changes in vision or hearing, sinus congestion, difficulty swallowing Pulmonary: Negative; No cough, wheezing,  shortness of breath, hemoptysis Cardiovascular:  See history of present illness GI: Negative; No nausea, vomiting, diarrhea, or abdominal pain GU: Negative; No dysuria, hematuria, or difficulty voiding Musculoskeletal: Negative; no myalgias, joint pain, or weakness Hematologic/Oncology: Negative; no easy bruising, bleeding Endocrine: Negative; no heat/cold intolerance; no diabetes Neuro: Negative; no changes in balance, headaches Skin: Negative; No rashes or skin lesions Psychiatric: Negative; No behavioral problems, depression Sleep: Negative; No snoring, daytime sleepiness, hypersomnolence, bruxism, restless legs, hypnogognic hallucinations, no cataplexy Other comprehensive 14 point system review is negative.  ROS     Physical Exam/Data:   Vitals:   05/19/17 1300 05/19/17 2007 05/20/17 0542 05/20/17 0650  BP: 125/90 105/66 (!) 116/59   Pulse: (!) 56 (!) 59 (!) 52   Resp: 17 17 16    Temp: 98.1 F (36.7 C) 98.2 F (36.8 C) 98 F (36.7 C)   TempSrc: Oral  Oral Oral   SpO2: 100% 99% 100%   Weight:    215 lb 6.4 oz (97.7 kg)  Height:        Intake/Output Summary (Last 24 hours) at 05/20/17 1255 Last data filed at 05/20/17 0930  Gross per 24 hour  Intake              730 ml  Output                0 ml  Net              730 ml   Filed Weights   05/18/17 0912 05/19/17 0602 05/20/17 0650  Weight: 201 lb (91.2 kg) 214 lb 14.4 oz (97.5 kg) 215 lb 6.4 oz (97.7 kg)   Body mass index is 40.7 kg/m.    Physical Exam BP (!) 146/86 (BP Location: Left Arm)   Pulse (!) 50   Temp 97.6 F (36.4 C) (Oral)   Resp 16   Ht 5\' 1"  (1.549 m)   Wt 215 lb 6.4 oz (97.7 kg)   SpO2 100%   BMI 40.70 kg/m  General: Alert, oriented, no distress.  Morbidly obese Skin: normal turgor, no rashes, warm and dry HEENT: Normocephalic, atraumatic. Pupils equal round and reactive to light; sclera anicteric; extraocular muscles intact;  Nose without nasal septal hypertrophy Mouth/Parynx benign;  Mallinpatti scale 3 Neck: Thick neck; No JVD, no carotid bruits; normal carotid upstroke Lungs: clear to ausculatation and percussion; no wheezing or rales Chest wall: without tenderness to palpitation Heart: PMI not displaced, RRR, s1 s2 normal, 1/6 systolic murmur, no diastolic murmur, no rubs, gallops, thrills, or heaves Abdomen: Central adiposity.  Bowel sounds positive.  Right upper quadrant tenderness.  Negative rebound Back: no CVA tenderness Pulses 2+ Musculoskeletal: full range of motion, normal strength, no joint deformities Extremities: no clubbing cyanosis or edema, Homan's sign negative  Neurologic: grossly nonfocal; Cranial nerves grossly wnl Psychologic: Normal mood and affect   EKG:  The EKG Fisher personally reviewed and demonstrates:  Normal sinus rhythm at 74 bpm.  Nonspecific ST changes.  No ectopy  Telemetry:  Telemetry Fisher personally reviewed and demonstrates:  Sinus rhythm at 80  Relevant CV Studies: I reviewed the patient's stent card from Lindsey Fisher wallet.  Lindsey Fisher had a  Xience Alpine 4.018 mm stent placed on 10/09/2014 in Lindsey Fisher mid LAD.  Laboratory Data:  Chemistry Recent Labs Lab 05/19/17 0146 05/19/17 1321 05/20/17 0319  NA 140 142 139  K 2.9* 3.6 3.3*  CL 105 106 106  CO2 26 30 24   GLUCOSE 99 88 93  BUN 6 6 7   CREATININE 0.83 0.77 0.88  CALCIUM 8.7* 8.9 8.6*  GFRNONAA >60 >60 >60  GFRAA >60 >60 >60  ANIONGAP 9 6 9      Recent Labs Lab 05/18/17 0924 05/19/17 0146 05/20/17 0637  PROT 7.5 6.7 6.3*  ALBUMIN 4.2 3.7 3.3*  AST 364* 205* 67*  ALT 231* 254* 156*  ALKPHOS 165* 212* 177*  BILITOT 1.6* 1.8* 1.3*   Hematology Recent Labs Lab 05/18/17 0924 05/19/17 0146  WBC 7.6 6.2  RBC 4.17 3.89  HGB 12.0 11.0*  HCT 36.0 33.8*  MCV 86.3 86.9  MCH 28.8 28.3  MCHC 33.3 32.5  RDW 14.9 15.3  PLT 262 237   Cardiac Enzymes Recent Labs Lab 05/18/17 0924 05/18/17 1419 05/18/17 2003 05/19/17 0146  TROPONINI <0.03 <0.03 <0.03 <0.03   No results  for input(s): TROPIPOC in the last 168 hours.  BNPNo  results for input(s): BNP, PROBNP in the last 168 hours.  DDimer  Recent Labs Lab 05/18/17 [phone]  DDIMER 1.58*    Radiology/Studies:  Dg Chest 2 View  Result Date: 05/18/2017 CLINICAL DATA:  56 year old female with heart palpitations and vomiting with centralized chest pressure. EXAM: CHEST  2 VIEW COMPARISON:  None. FINDINGS: The heart size and mediastinal contours are within normal limits. Both lungs are clear. The visualized skeletal structures are unremarkable. IMPRESSION: No active cardiopulmonary disease. Electronically Signed   By: Sande Brothers M.D.   On: 05/18/2017 09:39   Ct Angio Chest Pe W And/or Wo Contrast  Result Date: 05/18/2017 CLINICAL DATA:  Heart palpitations, central chest pressure. Positive D-dimer. EXAM: CT ANGIOGRAPHY CHEST WITH CONTRAST TECHNIQUE: Multidetector CT imaging of the chest Fisher performed using the standard protocol during bolus administration of intravenous contrast. Multiplanar CT image reconstructions and MIPs were obtained to evaluate the vascular anatomy. CONTRAST:  100 cc Isovue 370 IV COMPARISON:  Chest x-ray today. FINDINGS: Cardiovascular: Coronary artery calcifications in the left anterior descending coronary artery. Heart is normal size. Aorta is normal caliber. No filling defects in the pulmonary arteries to suggest pulmonary emboli. Mediastinum/Nodes: No mediastinal, hilar, or axillary adenopathy. Trachea and esophagus are unremarkable. Thyroid unremarkable. Lungs/Pleura: Lungs are clear. No focal airspace opacities or suspicious nodules. No effusions. Upper Abdomen: Imaging into the upper abdomen shows no acute findings. Musculoskeletal: Chest wall soft tissues are unremarkable. No acute bony abnormality. Review of the MIP images confirms the above findings. IMPRESSION: No evidence of pulmonary embolus. Coronary artery calcifications in the left anterior descending coronary artery. No acute  cardiopulmonary disease. Electronically Signed   By: Charlett Nose M.D.   On: 05/18/2017 10:52   Nm Hepatobiliary Liver Func  Result Date: 05/19/2017 CLINICAL DATA:  Inpatient. Cholelithiasis. Nausea and vomiting. Upper abdominal pain. EXAM: NUCLEAR MEDICINE HEPATOBILIARY IMAGING TECHNIQUE: Sequential images of the abdomen were obtained out to 60 minutes following intravenous administration of radiopharmaceutical. RADIOPHARMACEUTICALS:  5.2 mCi Tc-36m  Choletec IV COMPARISON:  05/18/2017 right upper quadrant abdominal sonogram. FINDINGS: Prompt uptake and biliary excretion of activity by the liver is seen. Gallbladder activity is visualized, consistent with patency of cystic duct. Biliary activity passes into small bowel, consistent with patent common bile duct. IMPRESSION: Normal hepatobiliary scintigraphy study. Gallbladder activity is visualized, indicating patent cystic duct, which is not compatible with acute cholecystitis. Electronically Signed   By: Delbert Phenix M.D.   On: 05/19/2017 20:33   US Abdomen Limited Ruq  Result Date: 05/18/2017 CLINICAL DATA:  Patient with epigastric pain and vomiting. EXAM: ULTRASOUND ABDOMEN LIMITED RIGHT UPPER QUADRANT COMPARISON:  None. FINDINGS: Gallbladder: Gallbladder is contracted with wall thickening measuring up to 5 mm. Stones are demonstrated within the gallbladder lumen. Possible sludge. Common bile duct: Diameter: 4 mm Liver: Liver is increased in echogenicity. No focal lesion is identified. Portal vein is patent on color Doppler imaging with normal direction of blood flow towards the liver. IMPRESSION: Gallbladder is contracted with associated wall thickening. Stones and possible sludge within the gallbladder lumen. Findings are nonspecific however may represent acute and/or chronic cholecystitis. Recommend clinical/ laboratory correlation. HIDA scan could be performed as clinically indicated. Hepatic steatosis. Electronically Signed   By: Annia Belt M.D.    On: 05/18/2017 11:46    Assessment and Plan:   1.  CAD:  The patient apparently may have suffered a small non-Q-wave MI several days following Lindsey Fisher hysterectomy surgery in December 2015 and subsequently underwent elective stenting to Lindsey Fisher  mid LAD with placement of a large 4.018 mm Xience Alpine DES stent.  Lindsey Fisher has been on chronic aspirin and Plavix since that time.  Lindsey Fisher's not had any subsequent cardiac evaluation with stress testing or echo.  Lindsey Fisher has not seen a cardiologist since Lindsey Fisher moved to [place] in April 2017.  Lindsey Fisher has experienced 2 episodes of chest pressure over the past several months.  I have recommended that Lindsey Fisher undergo a preoperative nuclear stress test and will try to schedule this for tomorrow.  We will also obtain a baseline echo Doppler study to assess systolic and diastolic function and valvular architecture preoperatively.  The patient's last dose of Plavix Fisher 05/19/2017.  Ideally, Plavix should be held for 5 days prior to surgery to reduce bleeding risk but can assess with P2 Y12 testing to assess Plavix responsiveness after 3 days if surgery is needed to be done sooner.  2.  Hypertension: The patient had been on amlodipine, atenolol and HCTZ previously.  Blood pressure today is stable.  3.  Acute cholecystitis: Evaluated by general surgery with need for surgery.  LFTs are elevated and improving.  4.  Hyperlipidemia: The patient has been on atorvastatin since Lindsey Fisher CAD detection.  With Lindsey Fisher elevation of LFTs will hold atorvastatin presently, and ultimately plan to reinstitute in the future.  following stabilization of liver function studies.   Signed, Nicki Guadalajara, MD, [facility]  05/20/2017 12:55 PM

## 2017-05-20 NOTE — Progress Notes (Signed)
PROGRESS NOTE    Lindsey Fisher  ONG:[id number] DOB: 1961/05/19 DOA: 05/18/2017 PCP: Lindsey Plum, MD    Brief Narrative: Lindsey Fisher is a 56 y.o. 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 Surgery requested a medical admission with HIDA scan for further evaluation. D-dimer was elevated to 1.58 and CTAP study is negative for PE or acute cardiac pulmonary disease. Right upper quadrant ultrasound reveals a contracted gallbladder with wall thickening, stones, possible sludge, and findings possibly reflective of acute and/or chronic cholecystitis.   Assessment & Plan:   Principal Problem:   Chest discomfort Active Problems:   Elevated LFTs   Cholelithiasis   CAD (coronary artery disease)   Personal history of DVT (deep vein thrombosis)   Essential hypertension   Nausea & vomiting   Substernal pain :  Resolved.  ? GERD.    Acute on chronic cholecystitis:  Surgery consulted, started on zosyn.  HIDA scan, neg for acute cholecystitis. Requested cardiology for pre op clearence, going to stress test in am, and echocardiogram.  Getting records from [facility] in [place].  Symptomatic management .  Hypertension:  Well controlled.    Hyperlipidemia:  Resume Lipitor.   Hypokalemia replaced.    DVT prophylaxis: (sq heparin)  Code Status: full code.  Family Communication: none at bedside.  Disposition Plan: surgery pending cardiology clearance.   Consultants:   Surgery.   Cardiology.    Procedures: none.   Antimicrobials: zosyn started.   Subjective: abd pain 2 to 3/10.  No nausea or vomiting.  No chest pain. .  Objective: Vitals:   05/19/17 1300 05/19/17 2007 05/20/17 0542 05/20/17 0650  BP: 125/90 105/66 (!) 116/59   Pulse: (!) 56 (!) 59 (!) 52   Resp: 17  17 16    Temp: 98.1 F (36.7 C) 98.2 F (36.8 C) 98 F (36.7 C)   TempSrc: Oral Oral Oral   SpO2: 100% 99% 100%   Weight:    97.7 kg (215 lb 6.4 oz)  Height:        Intake/Output Summary (Last 24 hours) at 05/20/17 1138 Last data filed at 05/20/17 0930  Gross per 24 hour  Intake              730 ml  Output                0 ml  Net              730 ml   Filed Weights   05/18/17 0912 05/19/17 0602 05/20/17 0650  Weight: 91.2 kg (201 lb) 97.5 kg (214 lb 14.4 oz) 97.7 kg (215 lb 6.4 oz)    Examination:  General exam: Appears calm and comfortable  Respiratory system: Clear to auscultation. Respiratory effort normal. Cardiovascular system: S1 & S2 heard, RRR. No JVD, murmurs, rubs, gallops or clicks. No pedal edema. Gastrointestinal system: Abdomen is soft mildly tender in the RUQ .  Central nervous system: Alert and oriented. No focal neurological deficits. Extremities: Symmetric 5 x 5 power. Skin: No rashes, lesions or ulcers Psychiatry: Judgement and insight appear normal. Mood & affect appropriate.     Data Reviewed: I have personally reviewed following labs and imaging studies  CBC:  Recent Labs Lab 05/18/17 0924 05/19/17 0146  WBC 7.6 6.2  NEUTROABS  --  4.8  HGB 12.0 11.0*  HCT  36.0 33.8*  MCV 86.3 86.9  PLT 262 237   Basic Metabolic Panel:  Recent Labs Lab 05/18/17 0924 05/19/17 0146 05/19/17 1321 05/20/17 0319  NA 140 140 142 139  K 3.6 2.9* 3.6 3.3*  CL 103 105 106 106  CO2 28 26 30 24   GLUCOSE 122* 99 88 93  BUN 9 6 6 7   CREATININE 0.84 0.83 0.77 0.88  CALCIUM 9.0 8.7* 8.9 8.6*  MG 2.0  --   --   --    GFR: Estimated Creatinine Clearance: 76.4 mL/min (by C-G formula based on SCr of 0.88 mg/dL). Liver Function Tests:  Recent Labs Lab 05/18/17 0924 05/19/17 0146 05/20/17 0637  AST 364* 205* 67*  ALT 231* 254* 156*  ALKPHOS 165* 212* 177*  BILITOT 1.6* 1.8* 1.3*  PROT 7.5 6.7 6.3*  ALBUMIN 4.2 3.7 3.3*    Recent Labs Lab  05/18/17 0924 05/19/17 0146  LIPASE 32 30   No results for input(s): AMMONIA in the last 168 hours. Coagulation Profile:  Recent Labs Lab 05/18/17 0924  INR 1.10   Cardiac Enzymes:  Recent Labs Lab 05/18/17 0924 05/18/17 1419 05/18/17 2003 05/19/17 0146  TROPONINI <0.03 <0.03 <0.03 <0.03   BNP (last 3 results) No results for input(s): PROBNP in the last 8760 hours. HbA1C: No results for input(s): HGBA1C in the last 72 hours. CBG:  Recent Labs Lab 05/19/17 1044 05/20/17 0752  GLUCAP 72 95   Lipid Profile: No results for input(s): CHOL, HDL, LDLCALC, TRIG, CHOLHDL, LDLDIRECT in the last 72 hours. Thyroid Function Tests: No results for input(s): TSH, T4TOTAL, FREET4, T3FREE, THYROIDAB in the last 72 hours. Anemia Panel: No results for input(s): VITAMINB12, FOLATE, FERRITIN, TIBC, IRON, RETICCTPCT in the last 72 hours. Sepsis Labs: No results for input(s): PROCALCITON, LATICACIDVEN in the last 168 hours.  No results found for this or any previous visit (from the past 240 hour(s)).       Radiology Studies: Nm Hepatobiliary Liver Func  Result Date: 05/19/2017 CLINICAL DATA:  Inpatient. Cholelithiasis. Nausea and vomiting. Upper abdominal pain. EXAM: NUCLEAR MEDICINE HEPATOBILIARY IMAGING TECHNIQUE: Sequential images of the abdomen were obtained out to 60 minutes following intravenous administration of radiopharmaceutical. RADIOPHARMACEUTICALS:  5.2 mCi Tc-51m  Choletec IV COMPARISON:  05/18/2017 right upper quadrant abdominal sonogram. FINDINGS: Prompt uptake and biliary excretion of activity by the liver is seen. Gallbladder activity is visualized, consistent with patency of cystic duct. Biliary activity passes into small bowel, consistent with patent common bile duct. IMPRESSION: Normal hepatobiliary scintigraphy study. Gallbladder activity is visualized, indicating patent cystic duct, which is not compatible with acute cholecystitis. Electronically Signed   By: Delbert Phenix M.D.   On: 05/19/2017 20:33   US Abdomen Limited Ruq  Result Date: 05/18/2017 CLINICAL DATA:  Patient with epigastric pain and vomiting. EXAM: ULTRASOUND ABDOMEN LIMITED RIGHT UPPER QUADRANT COMPARISON:  None. FINDINGS: Gallbladder: Gallbladder is contracted with wall thickening measuring up to 5 mm. Stones are demonstrated within the gallbladder lumen. Possible sludge. Common bile duct: Diameter: 4 mm Liver: Liver is increased in echogenicity. No focal lesion is identified. Portal vein is patent on color Doppler imaging with normal direction of blood flow towards the liver. IMPRESSION: Gallbladder is contracted with associated wall thickening. Stones and possible sludge within the gallbladder lumen. Findings are nonspecific however may represent acute and/or chronic cholecystitis. Recommend clinical/ laboratory correlation. HIDA scan could be performed as clinically indicated. Hepatic steatosis. Electronically Signed   By: Annia Belt M.D.   On:  05/18/2017 11:46        Scheduled Meds: . amLODipine  5 mg Oral Daily  . atenolol  25 mg Oral Daily  . atorvastatin  40 mg Oral q1800  . [START ON 05/24/2017] clopidogrel  75 mg Oral Daily  . heparin  5,000 Units Subcutaneous Q8H  . sodium chloride flush  3 mL Intravenous Q12H   Continuous Infusions: . famotidine (PEPCID) IV Stopped (05/19/17 2155)  . piperacillin-tazobactam (ZOSYN)  IV Stopped (05/20/17 0946)     LOS: 0 days    Time spent: 35 MINUTES.     Kathlen Mody, MD [facility] Pager [phone]  If 7PM-7AM, please contact night-coverage [email or web address] Password [email or web address] 05/20/2017, 11:38 AM

## 2017-05-20 NOTE — Progress Notes (Signed)
Subjective/Chief Complaint: RUQ pain at a "3", tolerating clears   Objective: Vital signs in last 24 hours: Temp:  [98 F (36.7 C)-98.2 F (36.8 C)] 98 F (36.7 C) (09/01 0542) Pulse Rate:  [52-59] 52 (09/01 0542) Resp:  [16-17] 16 (09/01 0542) BP: (105-125)/(59-90) 116/59 (09/01 0542) SpO2:  [99 %-100 %] 100 % (09/01 0542) Weight:  [97.7 kg (215 lb 6.4 oz)] 97.7 kg (215 lb 6.4 oz) (09/01 0650) Last BM Date: 05/17/17 (per pt)  Intake/Output from previous day: 08/31 0701 - 09/01 0700 In: 370 [P.O.:120; IV Piggyback:250] Out: -  Intake/Output this shift: No intake/output data recorded.  General appearance: cooperative Resp: clear to auscultation bilaterally Cardio: regular rate and rhythm GI: soft, minimal tenderness ruq  Lab Results:   Recent Labs  05/18/17 0924 05/19/17 0146  WBC 7.6 6.2  HGB 12.0 11.0*  HCT 36.0 33.8*  PLT 262 237   BMET  Recent Labs  05/19/17 1321 05/20/17 0319  NA 142 139  K 3.6 3.3*  CL 106 106  CO2 30 24  GLUCOSE 88 93  BUN 6 7  CREATININE 0.77 0.88  CALCIUM 8.9 8.6*   PT/INR  Recent Labs  05/18/17 0924  LABPROT 14.1  INR 1.10   ABG No results for input(s): PHART, HCO3 in the last 72 hours.  Invalid input(s): PCO2, PO2  Studies/Results: Dg Chest 2 View  Result Date: 05/18/2017 CLINICAL DATA:  56 year old female with heart palpitations and vomiting with centralized chest pressure. EXAM: CHEST  2 VIEW COMPARISON:  None. FINDINGS: The heart size and mediastinal contours are within normal limits. Both lungs are clear. The visualized skeletal structures are unremarkable. IMPRESSION: No active cardiopulmonary disease. Electronically Signed   By: Sande Brothers M.D.   On: 05/18/2017 09:39   Ct Angio Chest Pe W And/or Wo Contrast  Result Date: 05/18/2017 CLINICAL DATA:  Heart palpitations, central chest pressure. Positive D-dimer. EXAM: CT ANGIOGRAPHY CHEST WITH CONTRAST TECHNIQUE: Multidetector CT imaging of the chest was  performed using the standard protocol during bolus administration of intravenous contrast. Multiplanar CT image reconstructions and MIPs were obtained to evaluate the vascular anatomy. CONTRAST:  100 cc Isovue 370 IV COMPARISON:  Chest x-ray today. FINDINGS: Cardiovascular: Coronary artery calcifications in the left anterior descending coronary artery. Heart is normal size. Aorta is normal caliber. No filling defects in the pulmonary arteries to suggest pulmonary emboli. Mediastinum/Nodes: No mediastinal, hilar, or axillary adenopathy. Trachea and esophagus are unremarkable. Thyroid unremarkable. Lungs/Pleura: Lungs are clear. No focal airspace opacities or suspicious nodules. No effusions. Upper Abdomen: Imaging into the upper abdomen shows no acute findings. Musculoskeletal: Chest wall soft tissues are unremarkable. No acute bony abnormality. Review of the MIP images confirms the above findings. IMPRESSION: No evidence of pulmonary embolus. Coronary artery calcifications in the left anterior descending coronary artery. No acute cardiopulmonary disease. Electronically Signed   By: Charlett Nose M.D.   On: 05/18/2017 10:52   Nm Hepatobiliary Liver Func  Result Date: 05/19/2017 CLINICAL DATA:  Inpatient. Cholelithiasis. Nausea and vomiting. Upper abdominal pain. EXAM: NUCLEAR MEDICINE HEPATOBILIARY IMAGING TECHNIQUE: Sequential images of the abdomen were obtained out to 60 minutes following intravenous administration of radiopharmaceutical. RADIOPHARMACEUTICALS:  5.2 mCi Tc-64m  Choletec IV COMPARISON:  05/18/2017 right upper quadrant abdominal sonogram. FINDINGS: Prompt uptake and biliary excretion of activity by the liver is seen. Gallbladder activity is visualized, consistent with patency of cystic duct. Biliary activity passes into small bowel, consistent with patent common bile duct. IMPRESSION: Normal hepatobiliary scintigraphy  study. Gallbladder activity is visualized, indicating patent cystic duct, which  is not compatible with acute cholecystitis. Electronically Signed   By: Delbert Phenix M.D.   On: 05/19/2017 20:33   US Abdomen Limited Ruq  Result Date: 05/18/2017 CLINICAL DATA:  Patient with epigastric pain and vomiting. EXAM: ULTRASOUND ABDOMEN LIMITED RIGHT UPPER QUADRANT COMPARISON:  None. FINDINGS: Gallbladder: Gallbladder is contracted with wall thickening measuring up to 5 mm. Stones are demonstrated within the gallbladder lumen. Possible sludge. Common bile duct: Diameter: 4 mm Liver: Liver is increased in echogenicity. No focal lesion is identified. Portal vein is patent on color Doppler imaging with normal direction of blood flow towards the liver. IMPRESSION: Gallbladder is contracted with associated wall thickening. Stones and possible sludge within the gallbladder lumen. Findings are nonspecific however may represent acute and/or chronic cholecystitis. Recommend clinical/ laboratory correlation. HIDA scan could be performed as clinically indicated. Hepatic steatosis. Electronically Signed   By: Annia Belt M.D.   On: 05/18/2017 11:46    Anti-infectives: Anti-infectives    Start     Dose/Rate Route Frequency Ordered Stop   05/19/17 1400  piperacillin-tazobactam (ZOSYN) IVPB 3.375 g     3.375 g 12.5 mL/hr over 240 Minutes Intravenous Every 8 hours 05/19/17 1332        Assessment/Plan: Symptomatic Cholelithiasis, possible chronic cholecystitis - HIDA negative for acute cholecystitis, LFTs down slightly. GI eval P. Clears only. We will continue to assess for possible lap chole this admit. On Zosyn. CAD - await cardiology eval, holding Plavix  LOS: 0 days    Jabria Loos E 05/20/2017

## 2017-05-21 ENCOUNTER — Inpatient Hospital Stay (HOSPITAL_COMMUNITY): Payer: 59

## 2017-05-21 DIAGNOSIS — I2583 Coronary atherosclerosis due to lipid rich plaque: Secondary | ICD-10-CM

## 2017-05-21 DIAGNOSIS — I2510 Atherosclerotic heart disease of native coronary artery without angina pectoris: Secondary | ICD-10-CM

## 2017-05-21 LAB — COMPREHENSIVE METABOLIC PANEL
ALT: 109 U/L — ABNORMAL HIGH (ref 14–54)
AST: 33 U/L (ref 15–41)
Albumin: 3.3 g/dL — ABNORMAL LOW (ref 3.5–5.0)
Alkaline Phosphatase: 156 U/L — ABNORMAL HIGH (ref 38–126)
Anion gap: 7 (ref 5–15)
BILIRUBIN TOTAL: 1 mg/dL (ref 0.3–1.2)
BUN: 5 mg/dL — AB (ref 6–20)
CO2: 27 mmol/L (ref 22–32)
CREATININE: 0.89 mg/dL (ref 0.44–1.00)
Calcium: 8.8 mg/dL — ABNORMAL LOW (ref 8.9–10.3)
Chloride: 106 mmol/L (ref 101–111)
GFR calc Af Amer: >60 mL/min (ref >60)
Glucose, Bld: 96 mg/dL (ref 65–99)
POTASSIUM: 4 mmol/L (ref 3.5–5.1)
Sodium: 140 mmol/L (ref 135–145)
TOTAL PROTEIN: 6.6 g/dL (ref 6.5–8.1)

## 2017-05-21 LAB — NM MYOCAR MULTI W/SPECT W/WALL MOTION / EF
CSEPED: 4 min
Rest HR: 52 {beats}/min

## 2017-05-21 LAB — GLUCOSE, CAPILLARY: GLUCOSE-CAPILLARY: 80 mg/dL (ref 65–99)

## 2017-05-21 MED ORDER — TECHNETIUM TC 99M TETROFOSMIN IV KIT
10.0000 | PACK | Freq: Once | INTRAVENOUS | Status: AC | PRN
  Administered 2017-05-21: 10 via INTRAVENOUS

## 2017-05-21 MED ORDER — REGADENOSON 0.4 MG/5ML IV SOLN
INTRAVENOUS | Status: AC
  Filled 2017-05-21: qty 5

## 2017-05-21 MED ORDER — REGADENOSON 0.4 MG/5ML IV SOLN
0.4000 mg | Freq: Once | INTRAVENOUS | Status: AC
  Administered 2017-05-21: 0.4 mg via INTRAVENOUS
  Filled 2017-05-21: qty 5

## 2017-05-21 MED ORDER — TECHNETIUM TC 99M TETROFOSMIN IV KIT
30.0000 | PACK | Freq: Once | INTRAVENOUS | Status: AC | PRN
  Administered 2017-05-21: 30 via INTRAVENOUS

## 2017-05-21 NOTE — Progress Notes (Signed)
Progress Note  Patient Name: Lindsey Fisher Date of Encounter: 05/21/2017  Primary Cardiologist: Dr Cleda Clarks  Subjective   No chest pain  Inpatient Medications    Scheduled Meds: . amLODipine  5 mg Oral Daily  . atenolol  25 mg Oral Daily  . atorvastatin  40 mg Oral q1800  . [START ON 05/24/2017] clopidogrel  75 mg Oral Daily  . heparin  5,000 Units Subcutaneous Q8H  . regadenoson      . sodium chloride flush  3 mL Intravenous Q12H   Continuous Infusions: . famotidine (PEPCID) IV Stopped (05/20/17 2227)  . piperacillin-tazobactam (ZOSYN)  IV 3.375 g (05/20/17 1844)   PRN Meds: acetaminophen **OR** acetaminophen, albuterol, fentaNYL (SUBLIMAZE) injection, ondansetron **OR** ondansetron (ZOFRAN) IV   Vital Signs    Vitals:   05/21/17 0854 05/21/17 0903 05/21/17 0904 05/21/17 0905  BP: 131/89 (!) 177/94 (!) 171/97 (!) 167/98  Pulse:      Resp:      Temp:      TempSrc:      SpO2:      Weight:      Height:        Intake/Output Summary (Last 24 hours) at 05/21/17 0908 Last data filed at 05/20/17 1300  Gross per 24 hour  Intake              720 ml  Output                0 ml  Net              720 ml   Filed Weights   05/19/17 0602 05/20/17 0650 05/21/17 0545  Weight: 214 lb 14.4 oz (97.5 kg) 215 lb 6.4 oz (97.7 kg) 215 lb 9.6 oz (97.8 kg)    Telemetry    NSR - Personally Reviewed  ECG    NSR - Personally Reviewed  Physical Exam   GEN: overweight AA female, No acute distress.   Neck: No JVD Cardiac: RRR, no murmurs, rubs, or gallops.  Respiratory: Clear to auscultation bilaterally. GI: Soft, nontender, non-distended  MS: No edema; No deformity. Neuro:  Nonfocal  Psych: Normal affect   Labs    Chemistry Recent Labs Lab 05/19/17 0146 05/19/17 1321 05/20/17 0319 05/20/17 0637 05/21/17 0303  NA 140 142 139  --  140  K 2.9* 3.6 3.3*  --  4.0  CL 105 106 106  --  106  CO2 26 30 24   --  27  GLUCOSE 99 88 93  --  96  BUN 6 6 7   --  5*    CREATININE 0.83 0.77 0.88  --  0.89  CALCIUM 8.7* 8.9 8.6*  --  8.8*  PROT 6.7  --   --  6.3* 6.6  ALBUMIN 3.7  --   --  3.3* 3.3*  AST 205*  --   --  67* 33  ALT 254*  --   --  156* 109*  ALKPHOS 212*  --   --  177* 156*  BILITOT 1.8*  --   --  1.3* 1.0  GFRNONAA >60 >60 >60  --  >60  GFRAA >60 >60 >60  --  >60  ANIONGAP 9 6 9   --  7     Hematology Recent Labs Lab 05/18/17 0924 05/19/17 0146  WBC 7.6 6.2  RBC 4.17 3.89  HGB 12.0 11.0*  HCT 36.0 33.8*  MCV 86.3 86.9  MCH 28.8 28.3  MCHC 33.3 32.5  RDW  14.9 15.3  PLT 262 237    Cardiac Enzymes Recent Labs Lab 05/18/17 0924 05/18/17 1419 05/18/17 2003 05/19/17 0146  TROPONINI <0.03 <0.03 <0.03 <0.03   No results for input(s): TROPIPOC in the last 168 hours.   BNPNo results for input(s): BNP, PROBNP in the last 168 hours.   DDimer  Recent Labs Lab 05/18/17 [phone]  DDIMER 1.58*     Radiology    Nm Hepatobiliary Liver Func  Result Date: 05/19/2017 CLINICAL DATA:  Inpatient. Cholelithiasis. Nausea and vomiting. Upper abdominal pain. EXAM: NUCLEAR MEDICINE HEPATOBILIARY IMAGING TECHNIQUE: Sequential images of the abdomen were obtained out to 60 minutes following intravenous administration of radiopharmaceutical. RADIOPHARMACEUTICALS:  5.2 mCi Tc-28m  Choletec IV COMPARISON:  05/18/2017 right upper quadrant abdominal sonogram. FINDINGS: Prompt uptake and biliary excretion of activity by the liver is seen. Gallbladder activity is visualized, consistent with patency of cystic duct. Biliary activity passes into small bowel, consistent with patent common bile duct. IMPRESSION: Normal hepatobiliary scintigraphy study. Gallbladder activity is visualized, indicating patent cystic duct, which is not compatible with acute cholecystitis. Electronically Signed   By: Delbert Phenix M.D.   On: 05/19/2017 20:33    Cardiac Studies   Echo 05/20/17- Study Conclusions  - Left ventricle: The cavity size was normal. Wall thickness was    normal. Systolic function was normal. The estimated ejection   fraction was in the range of 55% to 60%. Wall motion was normal;   there were no regional wall motion abnormalities. Left   ventricular diastolic function parameters were normal. - Mitral valve: Calcified annulus.   Patient Profile     56 y.o. female with a history of CAD, s/p mLAD DES Jan 2016 in [place]. Admitted now with cholecystitis.  Assessment & Plan    1.  CAD:  The patient apparently may have suffered a small non-Q-wave MI several days following her hysterectomy surgery in December 2015 and subsequently underwent elective stenting to her mid LAD with placement of a large 4.018 mm Xience Alpine DES stent.  She has been on chronic aspirin and Plavix since that time.    2.  Hypertension: The patient had been on amlodipine, atenolol and HCTZ previously.  Blood pressure today is stable.  3.  Acute cholecystitis: Evaluated by general surgery with need for surgery.  LFTs are elevated and improving.  4.  Hyperlipidemia: The patient has been on atorvastatin since her CAD detection.  With her elevation of LFTs will hold atorvastatin presently, and ultimately plan to reinstitute in the future.  following stabilization of liver function studies.  Plan: Pre op Myoview today  Signed, Corine Shelter, PA-C  05/21/2017, 9:08 AM    I have seen, examined the patient, and reviewed the above assessment and plan.  On exam, ambulatory and without complaint. Changes to above are made where necessary.   Awaiting myoview results.  If low risk, would proceed with gallbladder surgery.  If high risk, additional CV testing may be required.  Co Sign: Hillis Range, MD 05/21/2017 12:30 PM

## 2017-05-21 NOTE — Progress Notes (Signed)
   Subjective/Chief Complaint: Patient is downstairs for nuclear medicine cardiac stress test According to chart, patient did not require any pain medication other than tylenol yesterday.   Objective: Vital signs in last 24 hours: Temp:  [97.6 F (36.4 C)-97.9 F (36.6 C)] 97.9 F (36.6 C) (09/02 0308) Pulse Rate:  [50-57] 50 (09/02 0308) Resp:  [16-19] 19 (09/02 0308) BP: (130-146)/(68-86) 130/68 (09/02 0308) SpO2:  [100 %] 100 % (09/02 0308) Weight:  [97.8 kg (215 lb 9.6 oz)] 97.8 kg (215 lb 9.6 oz) (09/02 0545) Last BM Date: 05/20/17  Intake/Output from previous day: 09/01 0701 - 09/02 0700 In: 720 [P.O.:720] Out: -  Intake/Output this shift: No intake/output data recorded.  PE deferred - patient off floor Lab Results:   Recent Labs  05/18/17 0924 05/19/17 0146  WBC 7.6 6.2  HGB 12.0 11.0*  HCT 36.0 33.8*  PLT 262 237   BMET  Recent Labs  05/20/17 0319 05/21/17 0303  NA 139 140  K 3.3* 4.0  CL 106 106  CO2 24 27  GLUCOSE 93 96  BUN 7 5*  CREATININE 0.88 0.89  CALCIUM 8.6* 8.8*   Hepatic Function Latest Ref Rng & Units 05/21/2017 05/20/2017 05/19/2017  Total Protein 6.5 - 8.1 g/dL 6.6 6.3(L) 6.7  Albumin 3.5 - 5.0 g/dL 3.3(L) 3.3(L) 3.7  AST 15 - 41 U/L 33 67(H) 205(H)  ALT 14 - 54 U/L 109(H) 156(H) 254(H)  Alk Phosphatase 38 - 126 U/L 156(H) 177(H) 212(H)  Total Bilirubin 0.3 - 1.2 mg/dL 1.0 1.3(H) 1.8(H)  Bilirubin, Direct 0.1 - 0.5 mg/dL - 0.3 -    PT/INR  Recent Labs  05/18/17 0924  LABPROT 14.1  INR 1.10   ABG No results for input(s): PHART, HCO3 in the last 72 hours.  Invalid input(s): PCO2, PO2  Studies/Results: Nm Hepatobiliary Liver Func  Result Date: 05/19/2017 CLINICAL DATA:  Inpatient. Cholelithiasis. Nausea and vomiting. Upper abdominal pain. EXAM: NUCLEAR MEDICINE HEPATOBILIARY IMAGING TECHNIQUE: Sequential images of the abdomen were obtained out to 60 minutes following intravenous administration of radiopharmaceutical.  RADIOPHARMACEUTICALS:  5.2 mCi Tc-81m Choletec IV COMPARISON:  05/18/2017 right upper quadrant abdominal sonogram. FINDINGS: Prompt uptake and biliary excretion of activity by the liver is seen. Gallbladder activity is visualized, consistent with patency of cystic duct. Biliary activity passes into small bowel, consistent with patent common bile duct. IMPRESSION: Normal hepatobiliary scintigraphy study. Gallbladder activity is visualized, indicating patent cystic duct, which is not compatible with acute cholecystitis. Electronically Signed   By: JIlona SorrelM.D.   On: 05/19/2017 20:33    Anti-infectives: Anti-infectives    Start     Dose/Rate Route Frequency Ordered Stop   05/19/17 1400  piperacillin-tazobactam (ZOSYN) IVPB 3.375 g     3.375 g 12.5 mL/hr over 240 Minutes Intravenous Every 8 hours 05/19/17 1332        Assessment/Plan: Symptomatic Cholelithiasis, possible chronic cholecystitis - HIDA negative for acute cholecystitis, LFTs improving Clears only. We will continue to assess for possible lap chole this admit. On Zosyn. CAD - await cardiology clearance, holding Plavix  Possible surgery tomorrow.  LOS: 1 day    Annalysia Willenbring K. 05/21/2017

## 2017-05-21 NOTE — Progress Notes (Signed)
Reviewed Myoview results with dr Johney Frame. He would like one of the cardiologist to review the images, will as Dr Mayford Knife to review in am.  Corine Shelter PA-C 05/21/2017 4:25 PM

## 2017-05-21 NOTE — Progress Notes (Signed)
PROGRESS NOTE    Lindsey Fisher  SAY:[id number] DOB: 27-Nov-1960 DOA: 05/18/2017 PCP: Jackie Plum, MD    Brief Narrative: Lindsey Fisher is a 56 y.o. 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 Surgery requested a medical admission with HIDA scan for further evaluation. D-dimer was elevated to 1.58 and CTAP study is negative for PE or acute cardiac pulmonary disease. Right upper quadrant ultrasound reveals a contracted gallbladder with wall thickening, stones, possible sludge, and findings possibly reflective of acute and/or chronic cholecystitis.   Assessment & Plan:   Principal Problem:   Chest discomfort Active Problems:   Elevated LFTs   Cholelithiasis   CAD (coronary artery disease)   Personal history of DVT (deep vein thrombosis)   Essential hypertension   Nausea & vomiting   Substernal pressure :  Resolved.  ? GERD.    Acute on chronic cholecystitis:  Surgery consulted, started on zosyn.  HIDA scan, neg for acute cholecystitis. Requested cardiology for pre op clearence, underwent stress test today showing the following Moderate size mild severity area of inducible ischemia in the anterior wall of the left ventricle. Small scar at the left ventricular apex. No left ventricular dilatation.  2. Normal left ventricular wall motion.  3. Left ventricular ejection fraction 63%  4. Non invasive risk stratification*: Intermediate  Pain control and anti emetics.   Hypertension:  Well controlled.    Hyperlipidemia:  Resume Lipitor.   Hypokalemia replaced.   CAD:  No chest pain or sob.    DVT prophylaxis: (sq heparin)  Code Status: full code.  Family Communication: none at bedside.  Disposition Plan: surgery pending cardiology clearance.   Consultants:   Surgery.    Cardiology.    Procedures: none.   Antimicrobials: zosyn started.   Subjective: No pain or sob.   Objective: Vitals:   05/21/17 0854 05/21/17 0903 05/21/17 0904 05/21/17 0905  BP: 131/89 (!) 177/94 (!) 171/97 (!) 167/98  Pulse:      Resp:      Temp:      TempSrc:      SpO2:      Weight:      Height:       No intake or output data in the 24 hours ending 05/21/17 1633 Filed Weights   05/19/17 0602 05/20/17 0650 05/21/17 0545  Weight: 97.5 kg (214 lb 14.4 oz) 97.7 kg (215 lb 6.4 oz) 97.8 kg (215 lb 9.6 oz)    Examination: unchanged from yesterday.  General exam: Appears calm and comfortable  Respiratory system: Clear to auscultation. Respiratory effort normal. Cardiovascular system: S1 & S2 heard, RRR. No JVD, murmurs, rubs, gallops or clicks. No pedal edema. Gastrointestinal system: Abdomen is soft mildly tender in the RUQ .  Central nervous system: Alert and oriented. No focal neurological deficits. Extremities: Symmetric 5 x 5 power. Skin: No rashes, lesions or ulcers Psychiatry: Judgement and insight appear normal. Mood & affect appropriate.     Data Reviewed: I have personally reviewed following labs and imaging studies  CBC:  Recent Labs Lab 05/18/17 0924 05/19/17 0146  WBC 7.6 6.2  NEUTROABS  --  4.8  HGB 12.0 11.0*  HCT 36.0 33.8*  MCV 86.3 86.9  PLT 262 237   Basic Metabolic Panel:  Recent Labs Lab 05/18/17 0924 05/19/17 0146 05/19/17 1321 05/20/17 0319 05/21/17 0303  NA 140 140  142 139 140  K 3.6 2.9* 3.6 3.3* 4.0  CL 103 105 106 106 106  CO2 28 26 30 24 27   GLUCOSE 122* 99 88 93 96  BUN 9 6 6 7  5*  CREATININE 0.84 0.83 0.77 0.88 0.89  CALCIUM 9.0 8.7* 8.9 8.6* 8.8*  MG 2.0  --   --   --   --    GFR: Estimated Creatinine Clearance: 75.5 mL/min (by C-G formula based on SCr of 0.89 mg/dL). Liver Function Tests:  Recent Labs Lab 05/18/17 0924 05/19/17 0146 05/20/17 0637 05/21/17 0303  AST 364* 205* 67* 33  ALT 231* 254*  156* 109*  ALKPHOS 165* 212* 177* 156*  BILITOT 1.6* 1.8* 1.3* 1.0  PROT 7.5 6.7 6.3* 6.6  ALBUMIN 4.2 3.7 3.3* 3.3*    Recent Labs Lab 05/18/17 0924 05/19/17 0146  LIPASE 32 30   No results for input(s): AMMONIA in the last 168 hours. Coagulation Profile:  Recent Labs Lab 05/18/17 0924  INR 1.10   Cardiac Enzymes:  Recent Labs Lab 05/18/17 0924 05/18/17 1419 05/18/17 2003 05/19/17 0146  TROPONINI <0.03 <0.03 <0.03 <0.03   BNP (last 3 results) No results for input(s): PROBNP in the last 8760 hours. HbA1C: No results for input(s): HGBA1C in the last 72 hours. CBG:  Recent Labs Lab 05/19/17 1044 05/20/17 0752 05/21/17 0727  GLUCAP 72 95 80   Lipid Profile: No results for input(s): CHOL, HDL, LDLCALC, TRIG, CHOLHDL, LDLDIRECT in the last 72 hours. Thyroid Function Tests: No results for input(s): TSH, T4TOTAL, FREET4, T3FREE, THYROIDAB in the last 72 hours. Anemia Panel: No results for input(s): VITAMINB12, FOLATE, FERRITIN, TIBC, IRON, RETICCTPCT in the last 72 hours. Sepsis Labs: No results for input(s): PROCALCITON, LATICACIDVEN in the last 168 hours.  No results found for this or any previous visit (from the past 240 hour(s)).       Radiology Studies: Nm Hepatobiliary Liver Func  Result Date: 05/19/2017 CLINICAL DATA:  Inpatient. Cholelithiasis. Nausea and vomiting. Upper abdominal pain. EXAM: NUCLEAR MEDICINE HEPATOBILIARY IMAGING TECHNIQUE: Sequential images of the abdomen were obtained out to 60 minutes following intravenous administration of radiopharmaceutical. RADIOPHARMACEUTICALS:  5.2 mCi Tc-72m  Choletec IV COMPARISON:  05/18/2017 right upper quadrant abdominal sonogram. FINDINGS: Prompt uptake and biliary excretion of activity by the liver is seen. Gallbladder activity is visualized, consistent with patency of cystic duct. Biliary activity passes into small bowel, consistent with patent common bile duct. IMPRESSION: Normal hepatobiliary  scintigraphy study. Gallbladder activity is visualized, indicating patent cystic duct, which is not compatible with acute cholecystitis. Electronically Signed   By: Delbert Phenix M.D.   On: 05/19/2017 20:33   Nm Myocar Multi W/spect W/wall Motion / Ef  Result Date: 05/21/2017 CLINICAL DATA:  Myocardial infarction in 2016. Prior angioplasty and stent. Hypertension. Family history of heart disease. Coronary artery disease. EXAM: MYOCARDIAL IMAGING WITH SPECT (REST AND PHARMACOLOGIC-STRESS) GATED LEFT VENTRICULAR WALL MOTION STUDY LEFT VENTRICULAR EJECTION FRACTION TECHNIQUE: Standard myocardial SPECT imaging was performed after resting intravenous injection of 10 mCi Tc-54m tetrofosmin. Subsequently, intravenous infusion of Lexiscan was performed under the supervision of the Cardiology staff. At peak effect of the drug, 30 mCi Tc-62m tetrofosmin was injected intravenously and standard myocardial SPECT imaging was performed. Quantitative gated imaging was also performed to evaluate left ventricular wall motion, and estimate left ventricular ejection fraction. COMPARISON:  None. FINDINGS: Perfusion: Moderate size mild severity region of inducible ischemia in the anterior wall extending from the mid heart to the apex, with  about 10% loss of activity. Small apical scar. There is some reverse redistribution in the left ventricular septal wall. Wall Motion: Normal left ventricular wall motion. No left ventricular dilation. Left Ventricular Ejection Fraction: 63 % End diastolic volume 95 ml End systolic volume 35 ml IMPRESSION: 1. Moderate size mild severity area of inducible ischemia in the anterior wall of the left ventricle. Small scar at the left ventricular apex. No left ventricular dilatation. 2. Normal left ventricular wall motion. 3. Left ventricular ejection fraction 63% 4. Non invasive risk stratification*: Intermediate *2012 Appropriate Use Criteria for Coronary Revascularization Focused Update: J Am Coll  Cardiol. 2012;59(9):857-881. http://content.dementiazones.com.aspx?articleid=[email or web address] Electronically Signed   By: Gaylyn Rong M.D.   On: 05/21/2017 10:36        Scheduled Meds: . amLODipine  5 mg Oral Daily  . atenolol  25 mg Oral Daily  . atorvastatin  40 mg Oral q1800  . [START ON 05/24/2017] clopidogrel  75 mg Oral Daily  . heparin  5,000 Units Subcutaneous Q8H  . regadenoson      . sodium chloride flush  3 mL Intravenous Q12H   Continuous Infusions: . famotidine (PEPCID) IV Stopped (05/20/17 2227)  . piperacillin-tazobactam (ZOSYN)  IV 3.375 g (05/21/17 1013)     LOS: 1 day    Time spent: 35 MINUTES.     Kathlen Mody, MD [facility] Pager [phone]  If 7PM-7AM, please contact night-coverage [email or web address] Password [facility] 05/21/2017, 4:33 PM

## 2017-05-22 LAB — GLUCOSE, CAPILLARY
GLUCOSE-CAPILLARY: 99 mg/dL (ref 65–99)
GLUCOSE-CAPILLARY: 100 mg/dL — AB (ref 65–99)
GLUCOSE-CAPILLARY: 90 mg/dL (ref 65–99)

## 2017-05-22 MED ORDER — SODIUM CHLORIDE 0.9 % WEIGHT BASED INFUSION: 1.0000 mL/kg/h | INTRAVENOUS | Status: DC

## 2017-05-22 MED ORDER — SODIUM CHLORIDE 0.9 % IV SOLN: 250.0000 mL | INTRAVENOUS | Status: DC | PRN

## 2017-05-22 MED ORDER — ASPIRIN 81 MG PO CHEW
81.0000 mg | CHEWABLE_TABLET | ORAL | Status: AC
  Administered 2017-05-23: 81 mg via ORAL
  Filled 2017-05-22: qty 1

## 2017-05-22 MED ORDER — SODIUM CHLORIDE 0.9 % WEIGHT BASED INFUSION
3.0000 mL/kg/h | INTRAVENOUS | Status: DC
  Administered 2017-05-23: 3 mL/kg/h via INTRAVENOUS

## 2017-05-22 MED ORDER — SODIUM CHLORIDE 0.9% FLUSH
3.0000 mL | Freq: Two times a day (BID) | INTRAVENOUS | Status: DC
  Administered 2017-05-22: 3 mL via INTRAVENOUS

## 2017-05-22 MED ORDER — SODIUM CHLORIDE 0.9% FLUSH: 3.0000 mL | INTRAVENOUS | Status: DC | PRN

## 2017-05-22 NOTE — Plan of Care (Signed)
Problem: Pain Managment: Goal: General experience of comfort will improve Outcome: Progressing Remains free of pain. Tolerates advanced diet.

## 2017-05-22 NOTE — Progress Notes (Signed)
Progress Note  Patient Name: Lindsey Fisher Date of Encounter: 05/22/2017  Primary Cardiologist: Dr Cleda Clarks  Subjective   Denies any chest pain or SOB  Inpatient Medications    Scheduled Meds: . amLODipine  5 mg Oral Daily  . atenolol  25 mg Oral Daily  . atorvastatin  40 mg Oral q1800  . heparin  5,000 Units Subcutaneous Q8H  . sodium chloride flush  3 mL Intravenous Q12H   Continuous Infusions: . famotidine (PEPCID) IV Stopped (05/21/17 2157)  . piperacillin-tazobactam (ZOSYN)  IV Stopped (05/22/17 0517)   PRN Meds: acetaminophen **OR** acetaminophen, albuterol, fentaNYL (SUBLIMAZE) injection, ondansetron **OR** ondansetron (ZOFRAN) IV   Vital Signs    Vitals:   05/21/17 0904 05/21/17 0905 05/21/17 2159 05/22/17 0612  BP: (!) 171/97 (!) 167/98 122/61 (!) 148/66  Pulse:   (!) 53 (!) 52  Resp:   16 18  Temp:   98 F (36.7 C) 98.2 F (36.8 C)  TempSrc:   Oral Oral  SpO2:   100%   Weight:    213 lb (96.6 kg)  Height:       No intake or output data in the 24 hours ending 05/22/17 0831 Filed Weights   05/20/17 0650 05/21/17 0545 05/22/17 0612  Weight: 215 lb 6.4 oz (97.7 kg) 215 lb 9.6 oz (97.8 kg) 213 lb (96.6 kg)    Telemetry    NSR - Personally Reviewed  ECG    No EKG to review - Personally Reviewed  Physical Exam   GEN: WD, WN in NAD Neck: NO JVD or bruit Cardiac: RRR with no M/R/G Respiratory: CTA bilaterally GI: soft, NT, ND with active BS MS: no edema Neuro: A&O x 3 Psych: normal mood  Labs    Chemistry  Recent Labs Lab 05/19/17 0146 05/19/17 1321 05/20/17 0319 05/20/17 0637 05/21/17 0303  NA 140 142 139  --  140  K 2.9* 3.6 3.3*  --  4.0  CL 105 106 106  --  106  CO2 26 30 24   --  27  GLUCOSE 99 88 93  --  96  BUN 6 6 7   --  5*  CREATININE 0.83 0.77 0.88  --  0.89  CALCIUM 8.7* 8.9 8.6*  --  8.8*  PROT 6.7  --   --  6.3* 6.6  ALBUMIN 3.7  --   --  3.3* 3.3*  AST 205*  --   --  67* 33  ALT 254*  --   --  156* 109*    ALKPHOS 212*  --   --  177* 156*  BILITOT 1.8*  --   --  1.3* 1.0  GFRNONAA >60 >60 >60  --  >60  GFRAA >60 >60 >60  --  >60  ANIONGAP 9 6 9   --  7     Hematology  Recent Labs Lab 05/18/17 0924 05/19/17 0146  WBC 7.6 6.2  RBC 4.17 3.89  HGB 12.0 11.0*  HCT 36.0 33.8*  MCV 86.3 86.9  MCH 28.8 28.3  MCHC 33.3 32.5  RDW 14.9 15.3  PLT 262 237    Cardiac Enzymes  Recent Labs Lab 05/18/17 0924 05/18/17 1419 05/18/17 2003 05/19/17 0146  TROPONINI <0.03 <0.03 <0.03 <0.03   No results for input(s): TROPIPOC in the last 168 hours.   BNPNo results for input(s): BNP, PROBNP in the last 168 hours.   DDimer   Recent Labs Lab 05/18/17 9147  DDIMER 1.58*     Radiology  Nm Myocar Multi W/spect W/wall Motion / Ef  Result Date: 05/21/2017 CLINICAL DATA:  Myocardial infarction in 2016. Prior angioplasty and stent. Hypertension. Family history of heart disease. Coronary artery disease. EXAM: MYOCARDIAL IMAGING WITH SPECT (REST AND PHARMACOLOGIC-STRESS) GATED LEFT VENTRICULAR WALL MOTION STUDY LEFT VENTRICULAR EJECTION FRACTION TECHNIQUE: Standard myocardial SPECT imaging was performed after resting intravenous injection of 10 mCi Tc-50m tetrofosmin. Subsequently, intravenous infusion of Lexiscan was performed under the supervision of the Cardiology staff. At peak effect of the drug, 30 mCi Tc-19m tetrofosmin was injected intravenously and standard myocardial SPECT imaging was performed. Quantitative gated imaging was also performed to evaluate left ventricular wall motion, and estimate left ventricular ejection fraction. COMPARISON:  None. FINDINGS: Perfusion: Moderate size mild severity region of inducible ischemia in the anterior wall extending from the mid heart to the apex, with about 10% loss of activity. Small apical scar. There is some reverse redistribution in the left ventricular septal wall. Wall Motion: Normal left ventricular wall motion. No left ventricular dilation. Left  Ventricular Ejection Fraction: 63 % End diastolic volume 95 ml End systolic volume 35 ml IMPRESSION: 1. Moderate size mild severity area of inducible ischemia in the anterior wall of the left ventricle. Small scar at the left ventricular apex. No left ventricular dilatation. 2. Normal left ventricular wall motion. 3. Left ventricular ejection fraction 63% 4. Non invasive risk stratification*: Intermediate *2012 Appropriate Use Criteria for Coronary Revascularization Focused Update: J Am Coll Cardiol. 2012;59(9):857-881. http://content.dementiazones.com.aspx?articleid=1201161 Electronically Signed   By: Gaylyn Rong M.D.   On: 05/21/2017 10:36    Cardiac Studies   Echo 05/20/17- Study Conclusions  - Left ventricle: The cavity size was normal. Wall thickness was   normal. Systolic function was normal. The estimated ejection   fraction was in the range of 55% to 60%. Wall motion was normal;   there were no regional wall motion abnormalities. Left   ventricular diastolic function parameters were normal. - Mitral valve: Calcified annulus.   Patient Profile     56 y.o. female with a history of CAD, s/p mLAD DES Jan 2016 in [place]. Admitted now with cholecystitis.  Assessment & Plan    1.  CAD:  The patient apparently may have suffered a small non-Q-wave MI several days following her hysterectomy surgery in December 2015 and subsequently underwent elective stenting to her mid LAD with placement of a large 4.018 mm Xience Alpine DES stent.  She has been on chronic aspirin and Plavix since that time.   - nuclear stress test for preop clearance concerning for anterior ischemia   - at this point I think best option is to make NPO after MN for cardiac cath in am to make sure that her stent is patent. - will place on cath board for cath   2.  Hypertension: BP well controlled today.   - continue amlodipine and atenolol  - HCTZ currently on hold .  3.  Acute cholecystitis: Evaluated by  general surgery with need for surgery.  LFTs are elevated and improving.  4.  Hyperlipidemia: The patient has been on atorvastatin since her CAD detection.   - atorvastatin currently on hold due to elevated LFTS but  ultimately plan to reinstitute in the future following stabilization of liver function studies.    Signed, Armanda Magic, MD  05/22/2017, 8:31 AM

## 2017-05-22 NOTE — Progress Notes (Signed)
PROGRESS NOTE    Lindsey Fisher  ZOX:[id number] DOB: 03-19-1961 DOA: 05/18/2017 PCP: Jackie Plum, MD    Brief Narrative: Lindsey Fisher is a 56 y.o. 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 Surgery requested a medical admission with HIDA scan for further evaluation. D-dimer was elevated to 1.58 and CTAP study is negative for PE or acute cardiac pulmonary disease. Right upper quadrant ultrasound reveals a contracted gallbladder with wall thickening, stones, possible sludge, and findings possibly reflective of acute and/or chronic cholecystitis.   Assessment & Plan:   Principal Problem:   Chest discomfort Active Problems:   Elevated LFTs   Cholelithiasis   CAD (coronary artery disease)   Personal history of DVT (deep vein thrombosis)   Essential hypertension   Nausea & vomiting   Substernal pressure :  Resolved.  ? GERD.    Acute on chronic cholecystitis:  Surgery consulted, started on zosyn.  HIDA scan, neg for acute cholecystitis. Requested cardiology for pre op clearence, underwent stress test today showing the following Moderate size mild severity area of inducible ischemia in the anterior wall of the left ventricle. Small scar at the left ventricular apex. No left ventricular dilatation.  2. Normal left ventricular wall motion.  3. Left ventricular ejection fraction 63%  4. Non invasive risk stratification*: Intermediate  Plan for cardiac cath in am. NPO after midnight.   Pain control and anti emetics.   Hypertension:  Well controlled.    Hyperlipidemia:  Resume Lipitor post cholecystectomy. .   Hypokalemia replaced.   CAD:  No chest pain or sob. S/p PCI to LAD.   Elevated liver function tests: improving.  Possibly passed a gall stone.    Mild normocytic anemia:    Monitor hemoglobin intermittently.    DVT prophylaxis: (sq heparin)  Code Status: full code.  Family Communication: none at bedside.  Disposition Plan: CATH IN AM.   Consultants:   Surgery.   Cardiology.    Procedures: stress test on 9/2  Cardiac cath in am.   Antimicrobials: zosyn started.   Subjective: No pain or sob. No nausea or vomiting.   Objective: Vitals:   05/21/17 0905 05/21/17 2159 05/22/17 0612 05/22/17 1333  BP: (!) 167/98 122/61 (!) 148/66 (!) 90/59  Pulse:  (!) 53 (!) 52 (!) 53  Resp:  16 18   Temp:  98 F (36.7 C) 98.2 F (36.8 C) 98.3 F (36.8 C)  TempSrc:  Oral Oral Oral  SpO2:  100%  100%  Weight:   96.6 kg (213 lb)   Height:        Intake/Output Summary (Last 24 hours) at 05/22/17 1634 Last data filed at 05/22/17 1300  Gross per 24 hour  Intake              120 ml  Output                0 ml  Net              120 ml   Filed Weights   05/20/17 0650 05/21/17 0545 05/22/17 0612  Weight: 97.7 kg (215 lb 6.4 oz) 97.8 kg (215 lb 9.6 oz) 96.6 kg (213 lb)    Examination:  Unchanged from yesterday.   General exam: Appears calm and comfortable  Respiratory system: Clear to auscultation. Respiratory effort normal. Cardiovascular system: S1 &  S2 heard, RRR. No JVD, murmurs, rubs, gallops or clicks. No pedal edema. Gastrointestinal system: Abdomen is soft mildly tender in the RUQ .  Central nervous system: Alert and oriented. No focal neurological deficits. Extremities: Symmetric 5 x 5 power. Skin: No rashes, lesions or ulcers Psychiatry: Judgement and insight appear normal. Mood & affect appropriate.     Data Reviewed: I have personally reviewed following labs and imaging studies  CBC:  Recent Labs Lab 05/18/17 0924 05/19/17 0146  WBC 7.6 6.2  NEUTROABS  --  4.8  HGB 12.0 11.0*  HCT 36.0 33.8*  MCV 86.3 86.9  PLT 262 237   Basic Metabolic Panel:  Recent Labs Lab 05/18/17 0924 05/19/17 0146 05/19/17 1321 05/20/17 0319  05/21/17 0303  NA 140 140 142 139 140  K 3.6 2.9* 3.6 3.3* 4.0  CL 103 105 106 106 106  CO2 28 26 30 24 27   GLUCOSE 122* 99 88 93 96  BUN 9 6 6 7  5*  CREATININE 0.84 0.83 0.77 0.88 0.89  CALCIUM 9.0 8.7* 8.9 8.6* 8.8*  MG 2.0  --   --   --   --    GFR: Estimated Creatinine Clearance: 75 mL/min (by C-G formula based on SCr of 0.89 mg/dL). Liver Function Tests:  Recent Labs Lab 05/18/17 0924 05/19/17 0146 05/20/17 0637 05/21/17 0303  AST 364* 205* 67* 33  ALT 231* 254* 156* 109*  ALKPHOS 165* 212* 177* 156*  BILITOT 1.6* 1.8* 1.3* 1.0  PROT 7.5 6.7 6.3* 6.6  ALBUMIN 4.2 3.7 3.3* 3.3*    Recent Labs Lab 05/18/17 0924 05/19/17 0146  LIPASE 32 30   No results for input(s): AMMONIA in the last 168 hours. Coagulation Profile:  Recent Labs Lab 05/18/17 0924  INR 1.10   Cardiac Enzymes:  Recent Labs Lab 05/18/17 0924 05/18/17 1419 05/18/17 2003 05/19/17 0146  TROPONINI <0.03 <0.03 <0.03 <0.03   BNP (last 3 results) No results for input(s): PROBNP in the last 8760 hours. HbA1C: No results for input(s): HGBA1C in the last 72 hours. CBG:  Recent Labs Lab 05/19/17 1044 05/20/17 0752 05/21/17 0727 05/22/17 0611 05/22/17 0740  GLUCAP 72 95 80 99 100*   Lipid Profile: No results for input(s): CHOL, HDL, LDLCALC, TRIG, CHOLHDL, LDLDIRECT in the last 72 hours. Thyroid Function Tests: No results for input(s): TSH, T4TOTAL, FREET4, T3FREE, THYROIDAB in the last 72 hours. Anemia Panel: No results for input(s): VITAMINB12, FOLATE, FERRITIN, TIBC, IRON, RETICCTPCT in the last 72 hours. Sepsis Labs: No results for input(s): PROCALCITON, LATICACIDVEN in the last 168 hours.  No results found for this or any previous visit (from the past 240 hour(s)).       Radiology Studies: Nm Myocar Multi W/spect W/wall Motion / Ef  Result Date: 05/21/2017 CLINICAL DATA:  Myocardial infarction in 2016. Prior angioplasty and stent. Hypertension. Family history of heart  disease. Coronary artery disease. EXAM: MYOCARDIAL IMAGING WITH SPECT (REST AND PHARMACOLOGIC-STRESS) GATED LEFT VENTRICULAR WALL MOTION STUDY LEFT VENTRICULAR EJECTION FRACTION TECHNIQUE: Standard myocardial SPECT imaging was performed after resting intravenous injection of 10 mCi Tc-40m tetrofosmin. Subsequently, intravenous infusion of Lexiscan was performed under the supervision of the Cardiology staff. At peak effect of the drug, 30 mCi Tc-28m tetrofosmin was injected intravenously and standard myocardial SPECT imaging was performed. Quantitative gated imaging was also performed to evaluate left ventricular wall motion, and estimate left ventricular ejection fraction. COMPARISON:  None. FINDINGS: Perfusion: Moderate size mild severity region of inducible ischemia in the anterior wall  extending from the mid heart to the apex, with about 10% loss of activity. Small apical scar. There is some reverse redistribution in the left ventricular septal wall. Wall Motion: Normal left ventricular wall motion. No left ventricular dilation. Left Ventricular Ejection Fraction: 63 % End diastolic volume 95 ml End systolic volume 35 ml IMPRESSION: 1. Moderate size mild severity area of inducible ischemia in the anterior wall of the left ventricle. Small scar at the left ventricular apex. No left ventricular dilatation. 2. Normal left ventricular wall motion. 3. Left ventricular ejection fraction 63% 4. Non invasive risk stratification*: Intermediate *2012 Appropriate Use Criteria for Coronary Revascularization Focused Update: J Am Coll Cardiol. 2012;59(9):857-881. http://content.dementiazones.com.aspx?articleid=[email or web address] Electronically Signed   By: Gaylyn Rong M.D.   On: 05/21/2017 10:36        Scheduled Meds: . amLODipine  5 mg Oral Daily  . [START ON 05/23/2017] aspirin  81 mg Oral Pre-Cath  . atenolol  25 mg Oral Daily  . atorvastatin  40 mg Oral q1800  . heparin  5,000 Units Subcutaneous Q8H  . sodium  chloride flush  3 mL Intravenous Q12H  . sodium chloride flush  3 mL Intravenous Q12H   Continuous Infusions: . sodium chloride    . [START ON 05/23/2017] sodium chloride     Followed by  . [START ON 05/23/2017] sodium chloride    . famotidine (PEPCID) IV Stopped (05/21/17 2157)  . piperacillin-tazobactam (ZOSYN)  IV 3.375 g (05/22/17 1127)     LOS: 2 days    Time spent: 35 MINUTES.     Kathlen Mody, MD [facility] Pager [phone]  If 7PM-7AM, please contact night-coverage [email or web address] Password [facility] 05/22/2017, 4:34 PM

## 2017-05-22 NOTE — Progress Notes (Signed)
Subjective/Chief Complaint: Mild nausea with clear liquids No abdominal pain  Still awaiting Cardiology opinion on testing yesterday. Plavix has been held since 8/31 No new LFT's today   Objective: Vital signs in last 24 hours: Temp:  [98 F (36.7 C)-98.2 F (36.8 C)] 98.2 F (36.8 C) (09/03 0612) Pulse Rate:  [52-53] 52 (09/03 0612) Resp:  [16-18] 18 (09/03 0612) BP: (122-177)/(61-98) 148/66 (09/03 0612) SpO2:  [100 %] 100 % (09/02 2159) Weight:  [96.6 kg (213 lb)] 96.6 kg (213 lb) (09/03 0612) Last BM Date: 05/20/17  Intake/Output from previous day: No intake/output data recorded. Intake/Output this shift: No intake/output data recorded.  WDWN in NAD Lungs - CTA B CV - RRR Abd - minimal RUQ tenderness  Lab Results:  No results for input(s): WBC, HGB, HCT, PLT in the last 72 hours. BMET  Recent Labs  05/20/17 0319 05/21/17 0303  NA 139 140  K 3.3* 4.0  CL 106 106  CO2 24 27  GLUCOSE 93 96  BUN 7 5*  CREATININE 0.88 0.89  CALCIUM 8.6* 8.8*   Hepatic Function Latest Ref Rng & Units 05/21/2017 05/20/2017 05/19/2017  Total Protein 6.5 - 8.1 g/dL 6.6 6.3(L) 6.7  Albumin 3.5 - 5.0 g/dL 3.3(L) 3.3(L) 3.7  AST 15 - 41 U/L 33 67(H) 205(H)  ALT 14 - 54 U/L 109(H) 156(H) 254(H)  Alk Phosphatase 38 - 126 U/L 156(H) 177(H) 212(H)  Total Bilirubin 0.3 - 1.2 mg/dL 1.0 1.3(H) 1.8(H)  Bilirubin, Direct 0.1 - 0.5 mg/dL - 0.3 -    PT/INR No results for input(s): LABPROT, INR in the last 72 hours. ABG No results for input(s): PHART, HCO3 in the last 72 hours.  Invalid input(s): PCO2, PO2  Studies/Results: Nm Myocar Multi W/spect W/wall Motion / Ef  Result Date: 05/21/2017 CLINICAL DATA:  Myocardial infarction in 2016. Prior angioplasty and stent. Hypertension. Family history of heart disease. Coronary artery disease. EXAM: MYOCARDIAL IMAGING WITH SPECT (REST AND PHARMACOLOGIC-STRESS) GATED LEFT VENTRICULAR WALL MOTION STUDY LEFT VENTRICULAR EJECTION FRACTION  TECHNIQUE: Standard myocardial SPECT imaging was performed after resting intravenous injection of 10 mCi Tc-78mtetrofosmin. Subsequently, intravenous infusion of Lexiscan was performed under the supervision of the Cardiology staff. At peak effect of the drug, 30 mCi Tc-981metrofosmin was injected intravenously and standard myocardial SPECT imaging was performed. Quantitative gated imaging was also performed to evaluate left ventricular wall motion, and estimate left ventricular ejection fraction. COMPARISON:  None. FINDINGS: Perfusion: Moderate size mild severity region of inducible ischemia in the anterior wall extending from the mid heart to the apex, with about 10% loss of activity. Small apical scar. There is some reverse redistribution in the left ventricular septal wall. Wall Motion: Normal left ventricular wall motion. No left ventricular dilation. Left Ventricular Ejection Fraction: 63 % End diastolic volume 95 ml End systolic volume 35 ml IMPRESSION: 1. Moderate size mild severity area of inducible ischemia in the anterior wall of the left ventricle. Small scar at the left ventricular apex. No left ventricular dilatation. 2. Normal left ventricular wall motion. 3. Left ventricular ejection fraction 63% 4. Non invasive risk stratification*: Intermediate *2012 Appropriate Use Criteria for Coronary Revascularization Focused Update: J Am Coll Cardiol. 203818;29(9):[email or web address] Electronically Signed   By: WaVan Clines.D.   On: 05/21/2017 10:36    Anti-infectives: Anti-infectives    Start     Dose/Rate Route Frequency Ordered Stop   05/19/17 1400  piperacillin-tazobactam (ZOSYN) IVPB 3.375 g     3.375 g 12.5  mL/hr over 240 Minutes Intravenous Every 8 hours 05/19/17 1332        Assessment/Plan: Symptomatic Cholelithiasis, possible chronic cholecystitis-  HIDA negative for acute cholecystitis, LFTs improving We will continue to assess  for possible lap chole this admit. On Zosyn. CAD- await cardiology clearance, holding Plavix May have heart healthy diet today - NPO p MN if she is a candidate for surgery. Possible surgery tomorrow.  LOS: 2 days    Treyce Spillers K. 05/22/2017

## 2017-05-23 ENCOUNTER — Encounter (HOSPITAL_COMMUNITY): Payer: Self-pay | Admitting: Interventional Cardiology

## 2017-05-23 ENCOUNTER — Encounter (HOSPITAL_COMMUNITY)
Admission: EM | Disposition: A | Discharge status: Home / Self Care | Payer: Self-pay | Source: Home / Self Care | Attending: Internal Medicine

## 2017-05-23 DIAGNOSIS — I25118 Atherosclerotic heart disease of native coronary artery with other forms of angina pectoris: Secondary | ICD-10-CM

## 2017-05-23 HISTORY — PX: LEFT HEART CATH AND CORONARY ANGIOGRAPHY: CATH118249

## 2017-05-23 SURGERY — LEFT HEART CATH AND CORONARY ANGIOGRAPHY
Anesthesia: LOCAL

## 2017-05-23 MED ORDER — HEPARIN (PORCINE) IN NACL 2-0.9 UNIT/ML-% IJ SOLN
INTRAMUSCULAR | Status: AC | PRN
  Administered 2017-05-23: 1000 mL

## 2017-05-23 MED ORDER — HEPARIN (PORCINE) IN NACL 2-0.9 UNIT/ML-% IJ SOLN
INTRAMUSCULAR | Status: AC
  Filled 2017-05-23: qty 1000

## 2017-05-23 MED ORDER — LIDOCAINE HCL (PF) 1 % IJ SOLN
INTRAMUSCULAR | Status: AC
  Filled 2017-05-23: qty 30

## 2017-05-23 MED ORDER — SODIUM CHLORIDE 0.9 % IV SOLN
INTRAVENOUS | Status: AC
  Administered 2017-05-23: 16:00:00 via INTRAVENOUS

## 2017-05-23 MED ORDER — SODIUM CHLORIDE 0.9% FLUSH: 3.0000 mL | INTRAVENOUS | Status: DC | PRN

## 2017-05-23 MED ORDER — ONDANSETRON HCL 4 MG/2ML IJ SOLN
4.0000 mg | Freq: Four times a day (QID) | INTRAMUSCULAR | Status: DC | PRN
  Administered 2017-05-25: 4 mg via INTRAVENOUS
  Filled 2017-05-23: qty 2

## 2017-05-23 MED ORDER — VERAPAMIL HCL 2.5 MG/ML IV SOLN
INTRAVENOUS | Status: AC
  Filled 2017-05-23: qty 2

## 2017-05-23 MED ORDER — FENTANYL CITRATE (PF) 100 MCG/2ML IJ SOLN
INTRAMUSCULAR | Status: AC
  Filled 2017-05-23: qty 2

## 2017-05-23 MED ORDER — SODIUM CHLORIDE 0.9 % IV SOLN: 250.0000 mL | INTRAVENOUS | Status: DC | PRN

## 2017-05-23 MED ORDER — HEPARIN SODIUM (PORCINE) 1000 UNIT/ML IJ SOLN
INTRAMUSCULAR | Status: AC
  Filled 2017-05-23: qty 1

## 2017-05-23 MED ORDER — FENTANYL CITRATE (PF) 100 MCG/2ML IJ SOLN
INTRAMUSCULAR | Status: DC | PRN
  Administered 2017-05-23: 25 ug via INTRAVENOUS

## 2017-05-23 MED ORDER — MIDAZOLAM HCL 2 MG/2ML IJ SOLN
INTRAMUSCULAR | Status: DC | PRN
  Administered 2017-05-23: 2 mg via INTRAVENOUS

## 2017-05-23 MED ORDER — IOPAMIDOL (ISOVUE-370) INJECTION 76%
INTRAVENOUS | Status: DC | PRN
  Administered 2017-05-23: 30 mL via INTRA_ARTERIAL

## 2017-05-23 MED ORDER — VERAPAMIL HCL 2.5 MG/ML IV SOLN
INTRAVENOUS | Status: DC | PRN
  Administered 2017-05-23: 10 mL via INTRA_ARTERIAL

## 2017-05-23 MED ORDER — SODIUM CHLORIDE 0.9% FLUSH
3.0000 mL | Freq: Two times a day (BID) | INTRAVENOUS | Status: DC
  Administered 2017-05-23 – 2017-05-25 (×3): 3 mL via INTRAVENOUS

## 2017-05-23 MED ORDER — MIDAZOLAM HCL 2 MG/2ML IJ SOLN
INTRAMUSCULAR | Status: AC
  Filled 2017-05-23: qty 2

## 2017-05-23 MED ORDER — ACETAMINOPHEN 325 MG PO TABS
650.0000 mg | ORAL_TABLET | ORAL | Status: DC | PRN
  Administered 2017-05-23: 650 mg via ORAL
  Filled 2017-05-23: qty 2

## 2017-05-23 MED ORDER — HEPARIN SODIUM (PORCINE) 1000 UNIT/ML IJ SOLN
INTRAMUSCULAR | Status: DC | PRN
  Administered 2017-05-23: 5000 [IU] via INTRAVENOUS

## 2017-05-23 SURGICAL SUPPLY — 12 items
CATH INFINITI 5 FR JL3.5 (CATHETERS) ×2 IMPLANT
CATH INFINITI JR4 5F (CATHETERS) ×2 IMPLANT
COVER PRB 48X5XTLSCP FOLD TPE (BAG) ×1 IMPLANT
COVER PROBE 5X48 (BAG) ×1
DEVICE RAD COMP TR BAND LRG (VASCULAR PRODUCTS) ×2 IMPLANT
GLIDESHEATH SLEND SS 6F .021 (SHEATH) ×2 IMPLANT
GUIDEWIRE INQWIRE 1.5J.035X260 (WIRE) ×1 IMPLANT
INQWIRE 1.5J .035X260CM (WIRE) ×2
KIT HEART LEFT (KITS) ×2 IMPLANT
PACK CARDIAC CATHETERIZATION (CUSTOM PROCEDURE TRAY) ×2 IMPLANT
TRANSDUCER W/STOPCOCK (MISCELLANEOUS) ×2 IMPLANT
TUBING CIL FLEX 10 FLL-RA (TUBING) ×2 IMPLANT

## 2017-05-23 NOTE — Progress Notes (Signed)
[facility]/Trauma Progress Note  Day of Surgery   Assessment/Plan HTN Coronary artery disease with LAD DES - on chronic ASA and plavix (held) Obesity   Symptomatic Cholelithiasis, possible chronic Cholecystitis - Gallbladder contracted with associated wall thickening. Stones and possible sludge within the gallbladder lumen. Findings are nonspecific however may represent acute and/or chronic cholecystitis - HIDA negative for acute cholecystitis, LFTs improving - WBC 6.2 and patient is afebrile - Alk Phos and Tbili trending down  FEN - heart healthy, NPO at midnight VTE - SCDs, heparin, holding plavix  ID- Zosyn (8/31>>)  Plan: Heart cath today showed patent LAD stent. Holding plavix. OR tomorrow for lap chole with IOC. NPO at midnight. Hold Heparin starting at 6am    LOS: 3 days    Subjective:  CC: mild nausea  No abdominal pain or vomiting. Nausea has resolved. Pt is tolerating her diet. Having flatus. No new complaints.   Objective: Vital signs in last 24 hours: Temp:  [98.1 F (36.7 C)] 98.1 F (36.7 C) (09/04 0500) Pulse Rate:  [46-58] 54 (09/04 1345) Resp:  [14-37] 14 (09/04 1345) BP: (121-149)/(73-85) 149/85 (09/04 1345) SpO2:  [96 %-100 %] 100 % (09/04 1345) Last BM Date: 05/20/17  Intake/Output from previous day: 09/03 0701 - 09/04 0700 In: 980 [P.O.:480; IV Piggyback:500] Out: 0  Intake/Output this shift: No intake/output data recorded.  PE: Gen:  Alert, NAD, pleasant, cooperative Card:  Bradycardic, regular rhythm, no M/G/R heard Pulm: rate and effort normal Abd: Soft, obese, not distended, +BS, no HSM, mild TTP in RUQ Skin: no rashes noted, warm and dry   Anti-infectives: Anti-infectives    Start     Dose/Rate Route Frequency Ordered Stop   05/19/17 1400  piperacillin-tazobactam (ZOSYN) IVPB 3.375 g     3.375 g 12.5 mL/hr over 240 Minutes Intravenous Every 8 hours 05/19/17 1332        Lab Results:  No results for  input(s): WBC, HGB, HCT, PLT in the last 72 hours. BMET  Recent Labs  05/21/17 0303  NA 140  K 4.0  CL 106  CO2 27  GLUCOSE 96  BUN 5*  CREATININE 0.89  CALCIUM 8.8*   PT/INR No results for input(s): LABPROT, INR in the last 72 hours. CMP     Component Value Date/Time   NA 140 05/21/2017 0303   K 4.0 05/21/2017 0303   CL 106 05/21/2017 0303   CO2 27 05/21/2017 0303   GLUCOSE 96 05/21/2017 0303   BUN 5 (L) 05/21/2017 0303   CREATININE 0.89 05/21/2017 0303   CALCIUM 8.8 (L) 05/21/2017 0303   PROT 6.6 05/21/2017 0303   ALBUMIN 3.3 (L) 05/21/2017 0303   AST 33 05/21/2017 0303   ALT 109 (H) 05/21/2017 0303   ALKPHOS 156 (H) 05/21/2017 0303   BILITOT 1.0 05/21/2017 0303   GFRNONAA >60 05/21/2017 0303   GFRAA >60 05/21/2017 0303   Lipase     Component Value Date/Time   LIPASE 30 05/19/2017 0146    Studies/Results: No results found.    Emine Lopata L Ravinder Lukehart , PA-C [facility] 05/23/2017, 2:05 PM Pager: [phone] Consults: [phone] Mon-Fri 7:00 am-4:30 pm Sat-Sun 7:00 am-11:30 am   

## 2017-05-23 NOTE — Progress Notes (Addendum)
Addendum: 05/23/17, 2:30 PM   Coronary angiography results reviewed. No evidence of restenosis or progression of coronary disease. In fact, coronaries are widely patent. The presentation seems almost certainly all related to acute cholecystitis.  Plan to proceed with cholecystectomy.   Remain off Plavix as the patient has completed greater than 12 months of therapy and has angiographically documented widely patent stent and coronary arteries.  Progress Note  Patient Name: Lindsey Fisher Date of Encounter: 05/23/2017  Primary Cardiologist: Bishop Limbo  Subjective   Admitted with epigastric and precordial discomfort. Prior history of LAD stent 2016. Aspirin and Plavix since that time. Xience 4.0 Alpine stent was deployed. Admitted on this occasion with epigastric discomfort but also a history of chest pressure. Felt clinically to have cholecystitis.   Inpatient Medications    Scheduled Meds: . amLODipine  5 mg Oral Daily  . atenolol  25 mg Oral Daily  . atorvastatin  40 mg Oral q1800  . heparin  5,000 Units Subcutaneous Q8H  . sodium chloride flush  3 mL Intravenous Q12H  . sodium chloride flush  3 mL Intravenous Q12H   Continuous Infusions: . sodium chloride    . sodium chloride 1 mL/kg/hr (05/23/17 0634)  . famotidine (PEPCID) IV Stopped (05/22/17 2101)  . piperacillin-tazobactam (ZOSYN)  IV Stopped (05/23/17 0919)   PRN Meds: sodium chloride, acetaminophen **OR** acetaminophen, albuterol, fentaNYL (SUBLIMAZE) injection, ondansetron **OR** ondansetron (ZOFRAN) IV, sodium chloride flush   Vital Signs    Vitals:   05/22/17 0612 05/22/17 1333 05/22/17 2029 05/23/17 0500  BP: (!) 148/66 (!) 90/59 136/73 (!) 145/77  Pulse: (!) 52 (!) 53 (!) 53 (!) 58  Resp: 18  18 18   Temp: 98.2 F (36.8 C) 98.3 F (36.8 C) 98.1 F (36.7 C) 98.1 F (36.7 C)  TempSrc: Oral Oral Oral Oral  SpO2:  100% 100% 96%  Weight: 213 lb (96.6 kg)     Height:        Intake/Output Summary (Last 24  hours) at 05/23/17 1126 Last data filed at 05/23/17 0600  Gross per 24 hour  Intake              980 ml  Output                0 ml  Net              980 ml   Filed Weights   05/20/17 0650 05/21/17 0545 05/22/17 0612  Weight: 215 lb 6.4 oz (97.7 kg) 215 lb 9.6 oz (97.8 kg) 213 lb (96.6 kg)    Telemetry    Normal sinus rhythm without significant ectopy - Personally Reviewed  ECG    Normal sinus rhythm with nonspecific ST-T wave changes in the precordial and inferior leads. No acute abnormality is noted. - Personally Reviewed  Physical Exam  Obese, short, well-developed African-American female. GEN: No acute distress.   Neck: No JVD Cardiac: RRR, no murmurs, rubs, or gallops.  Respiratory: Clear to auscultation bilaterally. GI: Soft, nontender, non-distended  MS: No edema; No deformity. Neuro:  Nonfocal  Psych: Normal affect   Labs    Chemistry Recent Labs Lab 05/19/17 0146 05/19/17 1321 05/20/17 0319 05/20/17 0637 05/21/17 0303  NA 140 142 139  --  140  K 2.9* 3.6 3.3*  --  4.0  CL 105 106 106  --  106  CO2 26 30 24   --  27  GLUCOSE 99 88 93  --  96  BUN 6  6 7  --  5*  CREATININE 0.83 0.77 0.88  --  0.89  CALCIUM 8.7* 8.9 8.6*  --  8.8*  PROT 6.7  --   --  6.3* 6.6  ALBUMIN 3.7  --   --  3.3* 3.3*  AST 205*  --   --  67* 33  ALT 254*  --   --  156* 109*  ALKPHOS 212*  --   --  177* 156*  BILITOT 1.8*  --   --  1.3* 1.0  GFRNONAA >60 >60 >60  --  >60  GFRAA >60 >60 >60  --  >60  ANIONGAP 9 6 9   --  7     Hematology Recent Labs Lab 05/18/17 0924 05/19/17 0146  WBC 7.6 6.2  RBC 4.17 3.89  HGB 12.0 11.0*  HCT 36.0 33.8*  MCV 86.3 86.9  MCH 28.8 28.3  MCHC 33.3 32.5  RDW 14.9 15.3  PLT 262 237    Cardiac Enzymes Recent Labs Lab 05/18/17 0924 05/18/17 1419 05/18/17 2003 05/19/17 0146  TROPONINI <0.03 <0.03 <0.03 <0.03   No results for input(s): TROPIPOC in the last 168 hours.   BNPNo results for input(s): BNP, PROBNP in the last 168  hours.   DDimer  Recent Labs Lab 05/18/17 [phone]  DDIMER 1.58*     Radiology    No results found.  Cardiac Studies   Echocardiography 10/20/2016: Study Conclusions  - Left ventricle: The cavity size was normal. Wall thickness was   normal. Systolic function was normal. The estimated ejection   fraction was in the range of 55% to 60%. Wall motion was normal;   there were no regional wall motion abnormalities. Left   ventricular diastolic function parameters were normal. - Mitral valve: Calcified annulus.  Impressions:  - Definity used; normal LV function; trace TR.   Myocardial perfusion study performed 05/21/2017: IMPRESSION: 1. Moderate size mild severity area of inducible ischemia in the anterior wall of the left ventricle. Small scar at the left ventricular apex. No left ventricular dilatation.  2. Normal left ventricular wall motion.  3. Left ventricular ejection fraction 63%  4. Non invasive risk stratification*: Intermediate  Abdominal Ultrasound 05/18/2017: IMPRESSION: Gallbladder is contracted with associated wall thickening. Stones and possible sludge within the gallbladder lumen. Findings are nonspecific however may represent acute and/or chronic cholecystitis. Recommend clinical/ laboratory correlation. HIDA scan could be performed as clinically indicated.  Hepatic steatosis.   Patient Profile     56 y.o. female  with a history of CAD, s/p mLAD DES Jan 2016 in [place]. Admitted now with cholecystitis.Myocardial perfusion imaging performed prior to cholecystectomy was moderately abnormal leading to coronary angiography.  Assessment & Plan    1. Probable acute cholecystitis with plan for cholecystectomy 2. Coronary artery disease with LAD DES placed in 2016 during ACS presentation. Patient has been on chronic aspirin and Plavix therapy since that time. A preoperative myocardial perfusion study demonstrated possible mild anterior ischemia. Now cardiac  catheterization is being performed to exclude significant CAD. 3. Essential hypertension on adequate therapy with good control 4. Hyperlipidemia  Plan diagnostic coronary angiography today. If she has restenosis, it may be prudent to perform angioplasty only rather than restenting to allow cholecystectomy to be done within the next 5-7 days. Hopefully there will not be significant obstructive disease present and cholecystectomy can be performed expediently. She will remain off Plavix for the time being.  Procedure and risk rediscussed.  Signed, Lesleigh Noe, MD  05/23/2017, 11:26 AM

## 2017-05-23 NOTE — Progress Notes (Signed)
PROGRESS NOTE    Lindsey Fisher  ZOX:[id number] DOB: Feb 13, 1961 DOA: 05/18/2017 PCP: Jackie Plum, MD    Brief Narrative: Lindsey Fisher is a 56 y.o. 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 Surgery requested a medical admission with HIDA scan for further evaluation. D-dimer was elevated to 1.58 and CTAP study is negative for PE or acute cardiac pulmonary disease. Right upper quadrant ultrasound reveals a contracted gallbladder with wall thickening, stones, possible sludge, and findings possibly reflective of acute and/or chronic cholecystitis. Cardiology consulted for pre op clearance and she underwent a stress test, showing Moderate size mild severity area of inducible ischemia in the anterior wall of the left ventricle. She is scheduled for cardiac catheterization today to evaluate the inducible ischemia of the anterior wall. Meanwhile her GI symptoms have resolved.   Assessment & Plan:   Principal Problem:   Chest discomfort Active Problems:   Elevated LFTs   Cholelithiasis   CAD (coronary artery disease)   Personal history of DVT (deep vein thrombosis)   Essential hypertension   Nausea & vomiting   Substernal pressure :  Resolved.  ? GERD.    Acute on chronic cholecystitis:  Surgery consulted, started on zosyn.  HIDA scan, neg for acute cholecystitis. Requested cardiology for pre op clearence, underwent stress test today showing the following Moderate size mild severity area of inducible ischemia in the anterior wall of the left ventricle. Small scar at the left ventricular apex. No left ventricular dilatation. Normal left ventricular wall motion. Left ventricular ejection fraction 63% cardiology plan to take her to cardiac catheterization today.    Pain control and anti emetics.    Hypertension:  Well controlled.    Hyperlipidemia:  Resume Lipitor post cholecystectomy. .   Hypokalemia replaced.   CAD:  No chest pain or sob. S/p PCI to LAD.   Elevated liver function tests: improving.  Possibly passed a gall stone.    Mild normocytic anemia:  Monitor hemoglobin intermittently.    DVT prophylaxis: (sq heparin)  Code Status: full code.  Family Communication: none at bedside.  Disposition Plan: pending cardiac catheterization.   Consultants:   Surgery.   Cardiology.    Procedures: stress test on 9/2  Cardiac cath in am.   Antimicrobials: zosyn  On 8/31  Subjective: No pain or sob. No nausea or vomiting. No other complaints.  Objective: Vitals:   05/22/17 0612 05/22/17 1333 05/22/17 2029 05/23/17 0500  BP: (!) 148/66 (!) 90/59 136/73 (!) 145/77  Pulse: (!) 52 (!) 53 (!) 53 (!) 58  Resp: 18  18 18   Temp: 98.2 F (36.8 C) 98.3 F (36.8 C) 98.1 F (36.7 C) 98.1 F (36.7 C)  TempSrc: Oral Oral Oral Oral  SpO2:  100% 100% 96%  Weight: 96.6 kg (213 lb)     Height:        Intake/Output Summary (Last 24 hours) at 05/23/17 0750 Last data filed at 05/23/17 0200  Gross per 24 hour  Intake              930 ml  Output                0 ml  Net              930 ml   Filed Weights   05/20/17 0650 05/21/17 0545 05/22/17 8119  Weight: 97.7 kg (215 lb 6.4 oz) 97.8 kg (215 lb 9.6 oz) 96.6 kg (213 lb)    Examination:    General exam: Appears calm and comfortable not in any distress.  Respiratory system: Clear to auscultation. Respiratory effort normal. No wheezing or rhonchi.  Cardiovascular system: S1 & S2 heard, RRR. No JVD, murmurs, rubs, gallops or clicks. No pedal edema. Gastrointestinal system: Abdomen is soft mildly tender in the RUQ .  Central nervous system: Alert and oriented. No focal neurological deficits. Extremities: Symmetric 5 x 5 power. Skin: No rashes, lesions or ulcers Psychiatry: Judgement and insight appear normal.  Mood & affect appropriate.     Data Reviewed: I have personally reviewed following labs and imaging studies  CBC:  Recent Labs Lab 05/18/17 0924 05/19/17 0146  WBC 7.6 6.2  NEUTROABS  --  4.8  HGB 12.0 11.0*  HCT 36.0 33.8*  MCV 86.3 86.9  PLT 262 237   Basic Metabolic Panel:  Recent Labs Lab 05/18/17 0924 05/19/17 0146 05/19/17 1321 05/20/17 0319 05/21/17 0303  NA 140 140 142 139 140  K 3.6 2.9* 3.6 3.3* 4.0  CL 103 105 106 106 106  CO2 28 26 30 24 27   GLUCOSE 122* 99 88 93 96  BUN 9 6 6 7  5*  CREATININE 0.84 0.83 0.77 0.88 0.89  CALCIUM 9.0 8.7* 8.9 8.6* 8.8*  MG 2.0  --   --   --   --    GFR: Estimated Creatinine Clearance: 75 mL/min (by C-G formula based on SCr of 0.89 mg/dL). Liver Function Tests:  Recent Labs Lab 05/18/17 0924 05/19/17 0146 05/20/17 0637 05/21/17 0303  AST 364* 205* 67* 33  ALT 231* 254* 156* 109*  ALKPHOS 165* 212* 177* 156*  BILITOT 1.6* 1.8* 1.3* 1.0  PROT 7.5 6.7 6.3* 6.6  ALBUMIN 4.2 3.7 3.3* 3.3*    Recent Labs Lab 05/18/17 0924 05/19/17 0146  LIPASE 32 30   No results for input(s): AMMONIA in the last 168 hours. Coagulation Profile:  Recent Labs Lab 05/18/17 0924  INR 1.10   Cardiac Enzymes:  Recent Labs Lab 05/18/17 0924 05/18/17 1419 05/18/17 2003 05/19/17 0146  TROPONINI <0.03 <0.03 <0.03 <0.03   BNP (last 3 results) No results for input(s): PROBNP in the last 8760 hours. HbA1C: No results for input(s): HGBA1C in the last 72 hours. CBG:  Recent Labs Lab 05/20/17 0752 05/21/17 0727 05/22/17 0611 05/22/17 0740 05/22/17 2153  GLUCAP 95 80 99 100* 90   Lipid Profile: No results for input(s): CHOL, HDL, LDLCALC, TRIG, CHOLHDL, LDLDIRECT in the last 72 hours. Thyroid Function Tests: No results for input(s): TSH, T4TOTAL, FREET4, T3FREE, THYROIDAB in the last 72 hours. Anemia Panel: No results for input(s): VITAMINB12, FOLATE, FERRITIN, TIBC, IRON, RETICCTPCT in the last 72 hours. Sepsis  Labs: No results for input(s): PROCALCITON, LATICACIDVEN in the last 168 hours.  No results found for this or any previous visit (from the past 240 hour(s)).       Radiology Studies: Nm Myocar Multi W/spect W/wall Motion / Ef  Result Date: 05/21/2017 CLINICAL DATA:  Myocardial infarction in 2016. Prior angioplasty and stent. Hypertension. Family history of heart disease. Coronary artery disease. EXAM: MYOCARDIAL IMAGING WITH SPECT (REST AND PHARMACOLOGIC-STRESS) GATED LEFT VENTRICULAR WALL MOTION STUDY LEFT VENTRICULAR EJECTION FRACTION TECHNIQUE: Standard myocardial SPECT imaging was performed after resting intravenous injection of 10 mCi Tc-13m tetrofosmin. Subsequently, intravenous infusion of Lexiscan was performed under the supervision of the Cardiology staff. At peak effect  of the drug, 30 mCi Tc-70m tetrofosmin was injected intravenously and standard myocardial SPECT imaging was performed. Quantitative gated imaging was also performed to evaluate left ventricular wall motion, and estimate left ventricular ejection fraction. COMPARISON:  None. FINDINGS: Perfusion: Moderate size mild severity region of inducible ischemia in the anterior wall extending from the mid heart to the apex, with about 10% loss of activity. Small apical scar. There is some reverse redistribution in the left ventricular septal wall. Wall Motion: Normal left ventricular wall motion. No left ventricular dilation. Left Ventricular Ejection Fraction: 63 % End diastolic volume 95 ml End systolic volume 35 ml IMPRESSION: 1. Moderate size mild severity area of inducible ischemia in the anterior wall of the left ventricle. Small scar at the left ventricular apex. No left ventricular dilatation. 2. Normal left ventricular wall motion. 3. Left ventricular ejection fraction 63% 4. Non invasive risk stratification*: Intermediate *2012 Appropriate Use Criteria for Coronary Revascularization Focused Update: J Am Coll Cardiol.  2012;59(9):857-881. http://content.[email or web address].aspx?articleid=[email or web address] Electronically Signed   By: Gaylyn Rong M.D.   On: 05/21/2017 10:36        Scheduled Meds: . amLODipine  5 mg Oral Daily  . atenolol  25 mg Oral Daily  . atorvastatin  40 mg Oral q1800  . heparin  5,000 Units Subcutaneous Q8H  . sodium chloride flush  3 mL Intravenous Q12H  . sodium chloride flush  3 mL Intravenous Q12H   Continuous Infusions: . sodium chloride    . sodium chloride 1 mL/kg/hr (05/23/17 0634)  . famotidine (PEPCID) IV Stopped (05/22/17 2101)  . piperacillin-tazobactam (ZOSYN)  IV 3.375 g (05/23/17 0519)     LOS: 3 days    Time spent: 35 MINUTES.     Kathlen Mody, MD [facility] Pager [phone]  If 7PM-7AM, please contact night-coverage [email or web address] Password [email or web address] 05/23/2017, 7:50 AM

## 2017-05-23 NOTE — Progress Notes (Signed)
Patient was on her bed when I arrived. I introduced myself and purpose for visitation which Pt. welcomed since she already had the paperwork with her started. Patient completed her section. Chaplain called the two volunteers who helped witness. There after Chaplain called the notary 4 times  but they were unavailable. Chaplain and patient also had good moments together sharing about their spiritual journey. Chaplain Blanchard Willhite will follow up for notary before end of the day.  Chaplain Thompson Mckim.

## 2017-05-23 NOTE — H&P (View-Only) (Signed)
Progress Note  Patient Name: Lindsey Fisher Date of Encounter: 05/23/2017  Primary Cardiologist: Bishop Limbo  Subjective   Admitted with epigastric and precordial discomfort. Prior history of LAD stent 2016. Aspirin and Plavix since that time. Xience 4.0 Alpine stent was deployed. Admitted on this occasion with epigastric discomfort but also a history of chest pressure. Felt clinically to have cholecystitis.  Currently pain-free.  Inpatient Medications    Scheduled Meds: . amLODipine  5 mg Oral Daily  . atenolol  25 mg Oral Daily  . atorvastatin  40 mg Oral q1800  . heparin  5,000 Units Subcutaneous Q8H  . sodium chloride flush  3 mL Intravenous Q12H  . sodium chloride flush  3 mL Intravenous Q12H   Continuous Infusions: . sodium chloride    . sodium chloride 1 mL/kg/hr (05/23/17 0634)  . famotidine (PEPCID) IV Stopped (05/22/17 2101)  . piperacillin-tazobactam (ZOSYN)  IV Stopped (05/23/17 0919)   PRN Meds: sodium chloride, acetaminophen **OR** acetaminophen, albuterol, fentaNYL (SUBLIMAZE) injection, ondansetron **OR** ondansetron (ZOFRAN) IV, sodium chloride flush   Vital Signs    Vitals:   05/22/17 0612 05/22/17 1333 05/22/17 2029 05/23/17 0500  BP: (!) 148/66 (!) 90/59 136/73 (!) 145/77  Pulse: (!) 52 (!) 53 (!) 53 (!) 58  Resp: 18  18 18   Temp: 98.2 F (36.8 C) 98.3 F (36.8 C) 98.1 F (36.7 C) 98.1 F (36.7 C)  TempSrc: Oral Oral Oral Oral  SpO2:  100% 100% 96%  Weight: 213 lb (96.6 kg)     Height:        Intake/Output Summary (Last 24 hours) at 05/23/17 1126 Last data filed at 05/23/17 0600  Gross per 24 hour  Intake              980 ml  Output                0 ml  Net              980 ml   Filed Weights   05/20/17 0650 05/21/17 0545 05/22/17 0612  Weight: 215 lb 6.4 oz (97.7 kg) 215 lb 9.6 oz (97.8 kg) 213 lb (96.6 kg)    Telemetry    Normal sinus rhythm without significant ectopy - Personally Reviewed  ECG    Normal sinus rhythm with  nonspecific ST-T wave changes in the precordial and inferior leads. No acute abnormality is noted. - Personally Reviewed  Physical Exam  Obese, short, well-developed African-American female. GEN: No acute distress.   Neck: No JVD Cardiac: RRR, no murmurs, rubs, or gallops.  Respiratory: Clear to auscultation bilaterally. GI: Soft, nontender, non-distended  MS: No edema; No deformity. Neuro:  Nonfocal  Psych: Normal affect   Labs    Chemistry Recent Labs Lab 05/19/17 0146 05/19/17 1321 05/20/17 0319 05/20/17 0637 05/21/17 0303  NA 140 142 139  --  140  K 2.9* 3.6 3.3*  --  4.0  CL 105 106 106  --  106  CO2 26 30 24   --  27  GLUCOSE 99 88 93  --  96  BUN 6 6 7   --  5*  CREATININE 0.83 0.77 0.88  --  0.89  CALCIUM 8.7* 8.9 8.6*  --  8.8*  PROT 6.7  --   --  6.3* 6.6  ALBUMIN 3.7  --   --  3.3* 3.3*  AST 205*  --   --  67* 33  ALT 254*  --   --  156* 109*  ALKPHOS 212*  --   --  177* 156*  BILITOT 1.8*  --   --  1.3* 1.0  GFRNONAA >60 >60 >60  --  >60  GFRAA >60 >60 >60  --  >60  ANIONGAP 9 6 9   --  7     Hematology Recent Labs Lab 05/18/17 0924 05/19/17 0146  WBC 7.6 6.2  RBC 4.17 3.89  HGB 12.0 11.0*  HCT 36.0 33.8*  MCV 86.3 86.9  MCH 28.8 28.3  MCHC 33.3 32.5  RDW 14.9 15.3  PLT 262 237    Cardiac Enzymes Recent Labs Lab 05/18/17 0924 05/18/17 1419 05/18/17 2003 05/19/17 0146  TROPONINI <0.03 <0.03 <0.03 <0.03   No results for input(s): TROPIPOC in the last 168 hours.   BNPNo results for input(s): BNP, PROBNP in the last 168 hours.   DDimer  Recent Labs Lab 05/18/17 [phone]  DDIMER 1.58*     Radiology    No results found.  Cardiac Studies   Echocardiography 10/20/2016: Study Conclusions  - Left ventricle: The cavity size was normal. Wall thickness was   normal. Systolic function was normal. The estimated ejection   fraction was in the range of 55% to 60%. Wall motion was normal;   there were no regional wall motion abnormalities.  Left   ventricular diastolic function parameters were normal. - Mitral valve: Calcified annulus.  Impressions:  - Definity used; normal LV function; trace TR.   Myocardial perfusion study performed 05/21/2017: IMPRESSION: 1. Moderate size mild severity area of inducible ischemia in the anterior wall of the left ventricle. Small scar at the left ventricular apex. No left ventricular dilatation.  2. Normal left ventricular wall motion.  3. Left ventricular ejection fraction 63%  4. Non invasive risk stratification*: Intermediate  Abdominal Ultrasound 05/18/2017: IMPRESSION: Gallbladder is contracted with associated wall thickening. Stones and possible sludge within the gallbladder lumen. Findings are nonspecific however may represent acute and/or chronic cholecystitis. Recommend clinical/ laboratory correlation. HIDA scan could be performed as clinically indicated.  Hepatic steatosis.   Patient Profile     56 y.o. female  with a history of CAD, s/p mLAD DES Jan 2016 in [place]. Admitted now with cholecystitis.Myocardial perfusion imaging performed prior to cholecystectomy was moderately abnormal leading to coronary angiography.  Assessment & Plan    1. Probable acute cholecystitis with plan for cholecystectomy 2. Coronary artery disease with LAD DES placed in 2016 during ACS presentation. Patient has been on chronic aspirin and Plavix therapy since that time. A preoperative myocardial perfusion study demonstrated possible mild anterior ischemia. Now cardiac catheterization is being performed to exclude significant CAD. 3. Essential hypertension on adequate therapy with good control 4. Hyperlipidemia  Plan diagnostic coronary angiography today. If she has restenosis, it may be prudent to perform angioplasty only rather than restenting to allow cholecystectomy to be done within the next 5-7 days. Hopefully there will not be significant obstructive disease present and  cholecystectomy can be performed expediently. She will remain off Plavix for the time being.  Procedure and risk rediscussed.  Signed, Lesleigh Noe, MD  05/23/2017, 11:26 AM

## 2017-05-23 NOTE — Interval H&P Note (Signed)
Cath Lab Visit (complete for each Cath Lab visit)  Clinical Evaluation Leading to the Procedure:   ACS: Yes.    Non-ACS:    Anginal Classification: CCS IV  Anti-ischemic medical therapy: Minimal Therapy (1 class of medications)  Non-Invasive Test Results: Intermediate-risk stress test findings: cardiac mortality 1-3%/year  Prior CABG: No previous CABG      History and Physical Interval Note:  05/23/2017 1:13 PM  Lindsey Fisher  has presented today for surgery, with the diagnosis of unstable angina  The various methods of treatment have been discussed with the patient and family. After consideration of risks, benefits and other options for treatment, the patient has consented to  Procedure(s): LEFT HEART CATH AND CORONARY ANGIOGRAPHY (N/A) as a surgical intervention .  The patient's history has been reviewed, patient examined, no change in status, stable for surgery.  I have reviewed the patient's chart and labs.  Questions were answered to the patient's satisfaction.     Lance Muss

## 2017-05-24 ENCOUNTER — Encounter (HOSPITAL_COMMUNITY): Payer: Self-pay | Admitting: Orthopedic Surgery

## 2017-05-24 ENCOUNTER — Inpatient Hospital Stay (HOSPITAL_COMMUNITY): Payer: 59

## 2017-05-24 ENCOUNTER — Inpatient Hospital Stay (HOSPITAL_COMMUNITY): Payer: 59 | Admitting: Certified Registered Nurse Anesthetist

## 2017-05-24 ENCOUNTER — Encounter (HOSPITAL_COMMUNITY)
Admission: EM | Disposition: A | Discharge status: Home / Self Care | Payer: Self-pay | Source: Home / Self Care | Attending: Internal Medicine

## 2017-05-24 DIAGNOSIS — I25119 Atherosclerotic heart disease of native coronary artery with unspecified angina pectoris: Secondary | ICD-10-CM

## 2017-05-24 HISTORY — PX: CHOLECYSTECTOMY: SHX55

## 2017-05-24 LAB — SURGICAL PCR SCREEN
MRSA, PCR: NEGATIVE
Staphylococcus aureus: NEGATIVE

## 2017-05-24 LAB — HIV ANTIBODY (ROUTINE TESTING W REFLEX): HIV Screen 4th Generation wRfx: NONREACTIVE

## 2017-05-24 SURGERY — LAPAROSCOPIC CHOLECYSTECTOMY WITH INTRAOPERATIVE CHOLANGIOGRAM
Anesthesia: General | Site: Abdomen

## 2017-05-24 MED ORDER — BUPIVACAINE-EPINEPHRINE (PF) 0.25% -1:200000 IJ SOLN
INTRAMUSCULAR | Status: AC
  Filled 2017-05-24: qty 30

## 2017-05-24 MED ORDER — OXYCODONE HCL 5 MG PO TABS
ORAL_TABLET | ORAL | Status: AC
  Filled 2017-05-24: qty 1

## 2017-05-24 MED ORDER — IOPAMIDOL (ISOVUE-300) INJECTION 61%
INTRAVENOUS | Status: AC
  Filled 2017-05-24: qty 50

## 2017-05-24 MED ORDER — 0.9 % SODIUM CHLORIDE (POUR BTL) OPTIME
TOPICAL | Status: DC | PRN
  Administered 2017-05-24: 1000 mL

## 2017-05-24 MED ORDER — FENTANYL CITRATE (PF) 100 MCG/2ML IJ SOLN
INTRAMUSCULAR | Status: DC | PRN
  Administered 2017-05-24 (×2): 50 ug via INTRAVENOUS
  Administered 2017-05-24: 100 ug via INTRAVENOUS
  Administered 2017-05-24 (×3): 50 ug via INTRAVENOUS

## 2017-05-24 MED ORDER — FENTANYL CITRATE (PF) 250 MCG/5ML IJ SOLN
INTRAMUSCULAR | Status: AC
Start: 2017-05-24 — End: 2017-05-24
  Filled 2017-05-24: qty 5

## 2017-05-24 MED ORDER — OXYCODONE HCL 5 MG PO TABS
5.0000 mg | ORAL_TABLET | Freq: Once | ORAL | Status: AC | PRN
  Administered 2017-05-24: 5 mg via ORAL

## 2017-05-24 MED ORDER — OXYCODONE HCL 5 MG/5ML PO SOLN: 5.0000 mg | Freq: Once | ORAL | Status: AC | PRN

## 2017-05-24 MED ORDER — LIDOCAINE 2% (20 MG/ML) 5 ML SYRINGE
INTRAMUSCULAR | Status: DC | PRN
  Administered 2017-05-24: 100 mg via INTRAVENOUS

## 2017-05-24 MED ORDER — ONDANSETRON HCL 4 MG/2ML IJ SOLN
INTRAMUSCULAR | Status: DC | PRN
  Administered 2017-05-24: 4 mg via INTRAVENOUS

## 2017-05-24 MED ORDER — SODIUM CHLORIDE 0.9 % IV SOLN
INTRAVENOUS | Status: DC | PRN
  Administered 2017-05-24: 17 mL

## 2017-05-24 MED ORDER — LACTATED RINGERS IV SOLN
INTRAVENOUS | Status: DC
  Administered 2017-05-24: 14:00:00 via INTRAVENOUS

## 2017-05-24 MED ORDER — SUGAMMADEX SODIUM 200 MG/2ML IV SOLN
INTRAVENOUS | Status: DC | PRN
  Administered 2017-05-24: 200 mg via INTRAVENOUS

## 2017-05-24 MED ORDER — HYDROMORPHONE HCL 1 MG/ML IJ SOLN: 0.2500 mg | INTRAMUSCULAR | Status: DC | PRN

## 2017-05-24 MED ORDER — ROCURONIUM BROMIDE 10 MG/ML (PF) SYRINGE
PREFILLED_SYRINGE | INTRAVENOUS | Status: DC | PRN
  Administered 2017-05-24 (×2): 10 mg via INTRAVENOUS
  Administered 2017-05-24: 50 mg via INTRAVENOUS
  Administered 2017-05-24: 20 mg via INTRAVENOUS

## 2017-05-24 MED ORDER — MIDAZOLAM HCL 2 MG/2ML IJ SOLN
INTRAMUSCULAR | Status: AC
  Filled 2017-05-24: qty 2

## 2017-05-24 MED ORDER — LABETALOL HCL 5 MG/ML IV SOLN
INTRAVENOUS | Status: DC | PRN
  Administered 2017-05-24 (×2): 5 mg via INTRAVENOUS

## 2017-05-24 MED ORDER — HYDROMORPHONE HCL 1 MG/ML IJ SOLN
INTRAMUSCULAR | Status: DC | PRN
  Administered 2017-05-24: 0.5 mg via INTRAVENOUS

## 2017-05-24 MED ORDER — LACTATED RINGERS IV SOLN
INTRAVENOUS | Status: DC
  Administered 2017-05-24: 10:00:00 via INTRAVENOUS

## 2017-05-24 MED ORDER — MIDAZOLAM HCL 5 MG/5ML IJ SOLN
INTRAMUSCULAR | Status: DC | PRN
  Administered 2017-05-24: 2 mg via INTRAVENOUS

## 2017-05-24 MED ORDER — FENTANYL CITRATE (PF) 250 MCG/5ML IJ SOLN
INTRAMUSCULAR | Status: AC
  Filled 2017-05-24: qty 5

## 2017-05-24 MED ORDER — LIDOCAINE 2% (20 MG/ML) 5 ML SYRINGE
INTRAMUSCULAR | Status: AC
  Filled 2017-05-24: qty 5

## 2017-05-24 MED ORDER — LABETALOL HCL 5 MG/ML IV SOLN
INTRAVENOUS | Status: AC
  Filled 2017-05-24: qty 4

## 2017-05-24 MED ORDER — PROPOFOL 10 MG/ML IV BOLUS
INTRAVENOUS | Status: AC
  Filled 2017-05-24: qty 20

## 2017-05-24 MED ORDER — MEPERIDINE HCL 25 MG/ML IJ SOLN: 6.2500 mg | INTRAMUSCULAR | Status: DC | PRN

## 2017-05-24 MED ORDER — LIDOCAINE HCL (PF) 1 % IJ SOLN
INTRAMUSCULAR | Status: AC
  Filled 2017-05-24: qty 30

## 2017-05-24 MED ORDER — HYDROMORPHONE HCL 1 MG/ML IJ SOLN
INTRAMUSCULAR | Status: AC
  Filled 2017-05-24: qty 0.5

## 2017-05-24 MED ORDER — EPHEDRINE SULFATE-NACL 50-0.9 MG/10ML-% IV SOSY
PREFILLED_SYRINGE | INTRAVENOUS | Status: DC | PRN
  Administered 2017-05-24 (×2): 5 mg via INTRAVENOUS

## 2017-05-24 MED ORDER — PROPOFOL 10 MG/ML IV BOLUS
INTRAVENOUS | Status: DC | PRN
  Administered 2017-05-24: 150 mg via INTRAVENOUS
  Administered 2017-05-24: 50 mg via INTRAVENOUS

## 2017-05-24 MED ORDER — LIDOCAINE HCL 1 % IJ SOLN
INTRAMUSCULAR | Status: DC | PRN
  Administered 2017-05-24: 15 mL via INTRAMUSCULAR

## 2017-05-24 MED ORDER — PHENYLEPHRINE 40 MCG/ML (10ML) SYRINGE FOR IV PUSH (FOR BLOOD PRESSURE SUPPORT)
PREFILLED_SYRINGE | INTRAVENOUS | Status: DC | PRN
  Administered 2017-05-24 (×2): 80 ug via INTRAVENOUS

## 2017-05-24 MED ORDER — PROMETHAZINE HCL 25 MG/ML IJ SOLN: 6.2500 mg | INTRAMUSCULAR | Status: DC | PRN

## 2017-05-24 MED ORDER — SUGAMMADEX SODIUM 200 MG/2ML IV SOLN
INTRAVENOUS | Status: AC
  Filled 2017-05-24: qty 2

## 2017-05-24 MED ORDER — SODIUM CHLORIDE 0.9 % IR SOLN
Status: DC | PRN
Start: 2017-05-24 — End: 2017-05-24
  Administered 2017-05-24: 1000 mL

## 2017-05-24 MED ORDER — PIPERACILLIN-TAZOBACTAM 3.375 G IVPB
3.3750 g | Freq: Three times a day (TID) | INTRAVENOUS | Status: AC
  Administered 2017-05-24: 3.375 g via INTRAVENOUS
  Filled 2017-05-24: qty 50

## 2017-05-24 MED ORDER — DEXAMETHASONE SODIUM PHOSPHATE 10 MG/ML IJ SOLN
INTRAMUSCULAR | Status: DC | PRN
  Administered 2017-05-24: 5 mg via INTRAVENOUS

## 2017-05-24 MED ORDER — ROCURONIUM BROMIDE 10 MG/ML (PF) SYRINGE
PREFILLED_SYRINGE | INTRAVENOUS | Status: AC
  Filled 2017-05-24: qty 5

## 2017-05-24 MED ORDER — PHENYLEPHRINE 40 MCG/ML (10ML) SYRINGE FOR IV PUSH (FOR BLOOD PRESSURE SUPPORT)
PREFILLED_SYRINGE | INTRAVENOUS | Status: AC
  Filled 2017-05-24: qty 10

## 2017-05-24 MED ORDER — DEXAMETHASONE SODIUM PHOSPHATE 10 MG/ML IJ SOLN
INTRAMUSCULAR | Status: AC
  Filled 2017-05-24: qty 1

## 2017-05-24 MED ORDER — HYDRALAZINE HCL 20 MG/ML IJ SOLN
INTRAMUSCULAR | Status: AC
  Filled 2017-05-24: qty 1

## 2017-05-24 MED ORDER — EPHEDRINE 5 MG/ML INJ
INTRAVENOUS | Status: AC
  Filled 2017-05-24: qty 10

## 2017-05-24 MED ORDER — ONDANSETRON HCL 4 MG/2ML IJ SOLN
INTRAMUSCULAR | Status: AC
  Filled 2017-05-24: qty 2

## 2017-05-24 SURGICAL SUPPLY — 42 items
APPLIER CLIP ROT 10 11.4 M/L (STAPLE) ×2
BLADE CLIPPER SURG (BLADE) IMPLANT
CANISTER SUCT 3000ML PPV (MISCELLANEOUS) ×2 IMPLANT
CHLORAPREP W/TINT 26ML (MISCELLANEOUS) ×2 IMPLANT
CLIP APPLIE ROT 10 11.4 M/L (STAPLE) ×1 IMPLANT
COVER MAYO STAND STRL (DRAPES) ×2 IMPLANT
COVER SURGICAL LIGHT HANDLE (MISCELLANEOUS) ×2 IMPLANT
DERMABOND ADVANCED (GAUZE/BANDAGES/DRESSINGS) ×1
DERMABOND ADVANCED .7 DNX12 (GAUZE/BANDAGES/DRESSINGS) ×1 IMPLANT
DRAPE C-ARM 42X72 X-RAY (DRAPES) IMPLANT
DRAPE WARM FLUID 44X44 (DRAPE) IMPLANT
ELECT REM PT RETURN 9FT ADLT (ELECTROSURGICAL) ×2
ELECTRODE REM PT RTRN 9FT ADLT (ELECTROSURGICAL) ×1 IMPLANT
FILTER SMOKE EVAC LAPAROSHD (FILTER) IMPLANT
GLOVE BIO SURGEON STRL SZ 6 (GLOVE) ×2 IMPLANT
GLOVE BIOGEL PI IND STRL 6.5 (GLOVE) ×1 IMPLANT
GLOVE BIOGEL PI INDICATOR 6.5 (GLOVE) ×1
GOWN STRL REUS W/ TWL LRG LVL3 (GOWN DISPOSABLE) ×2 IMPLANT
GOWN STRL REUS W/TWL 2XL LVL3 (GOWN DISPOSABLE) ×2 IMPLANT
GOWN STRL REUS W/TWL LRG LVL3 (GOWN DISPOSABLE) ×2
KIT BASIN OR (CUSTOM PROCEDURE TRAY) ×2 IMPLANT
KIT ROOM TURNOVER OR (KITS) ×2 IMPLANT
L-HOOK LAP DISP 36CM (ELECTROSURGICAL) ×2
LHOOK LAP DISP 36CM (ELECTROSURGICAL) ×1 IMPLANT
NS IRRIG 1000ML POUR BTL (IV SOLUTION) ×2 IMPLANT
PAD ARMBOARD 7.5X6 YLW CONV (MISCELLANEOUS) ×2 IMPLANT
PENCIL BUTTON HOLSTER BLD 10FT (ELECTRODE) ×2 IMPLANT
POUCH SPECIMEN RETRIEVAL 10MM (ENDOMECHANICALS) ×2 IMPLANT
SCISSORS LAP 5X35 DISP (ENDOMECHANICALS) ×2 IMPLANT
SET CHOLANGIOGRAPH 5 50 .035 (SET/KITS/TRAYS/PACK) IMPLANT
SET IRRIG TUBING LAPAROSCOPIC (IRRIGATION / IRRIGATOR) ×2 IMPLANT
SLEEVE ENDOPATH XCEL 5M (ENDOMECHANICALS) ×6 IMPLANT
SPECIMEN JAR SMALL (MISCELLANEOUS) ×2 IMPLANT
SUT MNCRL AB 4-0 PS2 18 (SUTURE) ×2 IMPLANT
SUT VICRYL 0 UR6 27IN ABS (SUTURE) ×2 IMPLANT
TOWEL OR 17X24 6PK STRL BLUE (TOWEL DISPOSABLE) ×2 IMPLANT
TOWEL OR 17X26 10 PK STRL BLUE (TOWEL DISPOSABLE) IMPLANT
TRAY LAPAROSCOPIC MC (CUSTOM PROCEDURE TRAY) ×2 IMPLANT
TROCAR XCEL BLUNT TIP 100MML (ENDOMECHANICALS) ×2 IMPLANT
TROCAR XCEL NON-BLD 11X100MML (ENDOMECHANICALS) ×2 IMPLANT
TROCAR XCEL NON-BLD 5MMX100MML (ENDOMECHANICALS) ×2 IMPLANT
TUBING INSUFFLATION (TUBING) ×2 IMPLANT

## 2017-05-24 NOTE — Progress Notes (Signed)
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 Surgery requested a medical admission with HIDA scan for further evaluation. D-dimer was elevated to 1.58 and CTAP study is negative for PE or acute cardiac pulmonary disease. Right upper quadrant ultrasound reveals a contracted gallbladder with wall thickening, stones, possible sludge, and findings possibly reflective of acute and/or chronic cholecystitis. Planning surgery later today after being clear by cardiology service.   Assessment/Plan: 1-acute cholecystitis  -continue zosyn -CCS on board -planning cholecystectomy later today -continue supportive care and PRN analgesics/antiemetics   2-CAD/chest discomfort -positive stress test, but neg cath for acute ischemic component -patient cleared for surgery by cardiology  -no CP at this moment -symptoms most likely due to GERD and/or gallbladder process   3-obesity -Body mass index is 40.53 kg/m. -low calorie diet and exercise discussed with patient   4-elevated liver function  -LFT's trending down  -most likely has passed gallstone -planning cholecystectomy and intra-operative cholangiogram  5-HTN -stable and well controlled -will continue current antihypertensive drugs  6-HLD -will resume statins at discharge once LFT's further down    Code Status: Full Family Communication: no family at bedside  Disposition Plan: surgery later today; will follow clinical response and post operative recovery.    Consultants:  Cardiology   General surgery   Procedures:  stress test 9/2  Cardiac cath 9/4  Planned cholecystectomy later today  9/5  Antibiotics:  Zosyn 8/31  HPI/Subjective: Afebrile, no CP, no SOB. Still with intermittent RUQ discomfort. No vomiting.   Objective: Vitals:   05/24/17 1415 05/24/17 1445  BP: 133/76 136/73  Pulse: (!) 58 (!) 58  Resp:    Temp:    SpO2: 95% 96%    Intake/Output Summary (Last 24 hours) at 05/24/17 1825 Last data filed at 05/24/17 1500  Gross per 24 hour  Intake             2230 ml  Output              620 ml  Net             1610 ml   Filed Weights   05/21/17 0545 05/22/17 0612 05/24/17 0521  Weight: 97.8 kg (215 lb 9.6 oz) 96.6 kg (213 lb) 97.3 kg (214 lb 8 oz)    Exam:   General:  Afebrile, denies CP and SOB. Reported still intermittent RUQ discomfort.  Cardiovascular: S1 and S2, no rubs, no gallops  Respiratory: CTA bilaterally   Abdomen: RUQ tenderness to palpation, positive BS, no guarding   Musculoskeletal: no edema, no cyanosis   Data Reviewed: Basic Metabolic Panel:  Recent Labs Lab 05/18/17 0924 05/19/17 0146 05/19/17 1321 05/20/17 0319 05/21/17 0303  NA 140 140 142 139 140  K 3.6 2.9* 3.6 3.3* 4.0  CL 103 105 106 106 106  CO2 28 26 30 24 27   GLUCOSE 122* 99 88 93 96  BUN 9 6 6 7  5*  CREATININE 0.84 0.83 0.77 0.88 0.89  CALCIUM 9.0 8.7* 8.9 8.6* 8.8*  MG 2.0  --   --   --   --    Liver Function Tests:  Recent Labs Lab 05/18/17 0924 05/19/17 0146 05/20/17 0637 05/21/17 0303  AST 364* 205* 67* 33  ALT 231* 254* 156* 109*  ALKPHOS 165* 212* 177* 156*  BILITOT 1.6* 1.8* 1.3* 1.0  PROT 7.5 6.7 6.3* 6.6  ALBUMIN 4.2 3.7 3.3* 3.3*    Recent Labs Lab 05/18/17 0924 05/19/17 0146  LIPASE 32 30   CBC:  Recent Labs Lab 05/18/17 0924 05/19/17 0146  WBC 7.6 6.2  NEUTROABS  --  4.8  HGB 12.0 11.0*  HCT 36.0 33.8*  MCV 86.3 86.9  PLT 262 237   Cardiac Enzymes:  Recent Labs Lab 05/18/17 0924 05/18/17 1419 05/18/17 2003 05/19/17 0146  TROPONINI <0.03 <0.03 <0.03 <0.03   CBG:  Recent Labs Lab 05/20/17 0752  05/21/17 0727 05/22/17 0611 05/22/17 0740 05/22/17 2153  GLUCAP 95 80 99 100* 90    Recent Results (from the past 240 hour(s))  Surgical pcr screen     Status: None   Collection Time: 05/24/17  9:50 AM  Result Value Ref Range Status   MRSA, PCR NEGATIVE NEGATIVE Final   Staphylococcus aureus NEGATIVE NEGATIVE Final    Comment: (NOTE) The Xpert SA Assay (FDA approved for NASAL specimens in patients 57 years of age and older), is one component of a comprehensive surveillance program. It is not intended to diagnose infection nor to guide or monitor treatment.      Studies: Dg Cholangiogram Operative  Result Date: 05/24/2017 CLINICAL DATA:  Intraoperative cholangiogram during laparoscopic cholecystectomy. EXAM: INTRAOPERATIVE CHOLANGIOGRAM FLUOROSCOPY TIME:  26 seconds COMPARISON:  Nuclear medicine HIDA scan - 05/19/2017 FINDINGS: Intraoperative cholangiographic images of the right upper abdominal quadrant during laparoscopic cholecystectomy are provided for review. Surgical clips overlie the expected location of the gallbladder fossa. Contrast injection demonstrates selective cannulation of the central aspect of the cystic duct. There is passage of contrast through the central aspect of the cystic duct with filling of a non dilated common bile duct. There is passage of contrast though the CBD and into the descending portion of the duodenum. There is minimal reflux of injected contrast into the common hepatic duct and central aspect of the non dilated intrahepatic biliary system. Transient nonocclusive filling defect within the mid and central aspect of the common bile duct is favored to represent an air bubble. No definitive persistent filling defects within the opacified portions of the biliary system to suggest the presence of choledocholithiasis. IMPRESSION: No evidence of choledocholithiasis. Electronically Signed   By: Simonne Come M.D.   On: 05/24/2017 11:58    Scheduled Meds: .  amLODipine  5 mg Oral Daily  . atenolol  25 mg Oral Daily  . atorvastatin  40 mg Oral q1800  . hydrALAZINE      . oxyCODONE      . sodium chloride flush  3 mL Intravenous Q12H  . sodium chloride flush  3 mL Intravenous Q12H   Continuous Infusions: . sodium chloride    . famotidine (PEPCID) IV Stopped (05/23/17 2131)  . lactated ringers 100 mL/hr at 05/24/17 1413    Principal Problem:   Chest discomfort Active Problems:   Elevated LFTs   Cholelithiasis   CAD (coronary artery disease)   Personal history of DVT (deep vein thrombosis)   Essential hypertension   Nausea & vomiting    Time spent: 25 minutes    Vassie Loll  [facility] Pager [phone]. If 7PM-7AM, please contact night-coverage at [email or web address], password [facility] 05/24/2017, 6:25 PM  LOS: 4 days

## 2017-05-24 NOTE — H&P (View-Only) (Signed)
[facility]/Trauma Progress Note  Day of Surgery   Assessment/Plan HTN Coronary artery disease with LAD DES - on chronic ASA and plavix (held) Obesity   Symptomatic Cholelithiasis, possible chronic Cholecystitis - Gallbladder contracted with associated wall thickening. Stones and possible sludge within the gallbladder lumen. Findings are nonspecific however may represent acute and/or chronic cholecystitis - HIDA negative for acute cholecystitis, LFTs improving - WBC 6.2 and patient is afebrile - Alk Phos and Tbili trending down  FEN - heart healthy, NPO at midnight VTE - SCDs, heparin, holding plavix  ID- Zosyn (8/31>>)  Plan: Heart cath today showed patent LAD stent. Holding plavix. OR tomorrow for lap chole with IOC. NPO at midnight. Hold Heparin starting at 6am    LOS: 3 days    Subjective:  CC: mild nausea  No abdominal pain or vomiting. Nausea has resolved. Pt is tolerating her diet. Having flatus. No new complaints.   Objective: Vital signs in last 24 hours: Temp:  [98.1 F (36.7 C)] 98.1 F (36.7 C) (09/04 0500) Pulse Rate:  [46-58] 54 (09/04 1345) Resp:  [14-37] 14 (09/04 1345) BP: (121-149)/(73-85) 149/85 (09/04 1345) SpO2:  [96 %-100 %] 100 % (09/04 1345) Last BM Date: 05/20/17  Intake/Output from previous day: 09/03 0701 - 09/04 0700 In: 980 [P.O.:480; IV Piggyback:500] Out: 0  Intake/Output this shift: No intake/output data recorded.  PE: Gen:  Alert, NAD, pleasant, cooperative Card:  Bradycardic, regular rhythm, no M/G/R heard Pulm: rate and effort normal Abd: Soft, obese, not distended, +BS, no HSM, mild TTP in RUQ Skin: no rashes noted, warm and dry   Anti-infectives: Anti-infectives    Start     Dose/Rate Route Frequency Ordered Stop   05/19/17 1400  piperacillin-tazobactam (ZOSYN) IVPB 3.375 g     3.375 g 12.5 mL/hr over 240 Minutes Intravenous Every 8 hours 05/19/17 1332        Lab Results:  No results for  input(s): WBC, HGB, HCT, PLT in the last 72 hours. BMET  Recent Labs  05/21/17 0303  NA 140  K 4.0  CL 106  CO2 27  GLUCOSE 96  BUN 5*  CREATININE 0.89  CALCIUM 8.8*   PT/INR No results for input(s): LABPROT, INR in the last 72 hours. CMP     Component Value Date/Time   NA 140 05/21/2017 0303   K 4.0 05/21/2017 0303   CL 106 05/21/2017 0303   CO2 27 05/21/2017 0303   GLUCOSE 96 05/21/2017 0303   BUN 5 (L) 05/21/2017 0303   CREATININE 0.89 05/21/2017 0303   CALCIUM 8.8 (L) 05/21/2017 0303   PROT 6.6 05/21/2017 0303   ALBUMIN 3.3 (L) 05/21/2017 0303   AST 33 05/21/2017 0303   ALT 109 (H) 05/21/2017 0303   ALKPHOS 156 (H) 05/21/2017 0303   BILITOT 1.0 05/21/2017 0303   GFRNONAA >60 05/21/2017 0303   GFRAA >60 05/21/2017 0303   Lipase     Component Value Date/Time   LIPASE 30 05/19/2017 0146    Studies/Results: No results found.    Kalman Drape , [facility] Surgery 05/23/2017, 2:05 PM Pager: [phone] Consults: [phone] Mon-Fri 7:00 am-4:30 pm Sat-Sun 7:00 am-11:30 am

## 2017-05-24 NOTE — Transfer of Care (Signed)
Immediate Anesthesia Transfer of Care Note  Patient: Lindsey Fisher  Procedure(s) Performed: Procedure(s): LAPAROSCOPIC CHOLECYSTECTOMY WITH INTRAOPERATIVE CHOLANGIOGRAM (N/A)  Patient Location: PACU  Anesthesia Type:General  Level of Consciousness: awake, alert , oriented and patient cooperative  Airway & Oxygen Therapy: Patient Spontanous Breathing and Patient connected to nasal cannula oxygen  Post-op Assessment: Report given to RN and Post -op Vital signs reviewed and stable  Post vital signs: Reviewed and stable  Last Vitals:  Vitals:   05/24/17 0521 05/24/17 1230  BP: (!) 155/92   Pulse: 68   Resp: 18   Temp: 36.7 C (P) 36.6 C  SpO2: 100%     Last Pain:  Vitals:   05/23/17 2038  TempSrc: Oral  PainSc:          Complications: No apparent anesthesia complications

## 2017-05-24 NOTE — Op Note (Signed)
Laparoscopic Cholecystectomy with IOC Procedure Note  Indications: This patient presents with acute calculous cholecystitis and will undergo laparoscopic cholecystectomy.  Pre-operative Diagnosis: Acute calculous cholecystitis  Post-operative Diagnosis: Same  Surgeon: Almond Lint   Assistants: Wells Guiles, PA-C  Anesthesia: General endotracheal anesthesia and local  ASA Class: 3  Procedure Details  The patient was seen again in the Holding Room. The risks, benefits, complications, treatment options, and expected outcomes were discussed with the patient. The possibilities of  bleeding, recurrent infection, damage to nearby structures, the need for additional procedures, failure to diagnose a condition, the possible need to convert to an open procedure, and creating a complication requiring transfusion or operation were discussed with the patient. The likelihood of improving the patient's symptoms with return to their baseline status is good.    The patient and/or family concurred with the proposed plan, giving informed consent. The site of surgery properly noted. The patient was taken to Operating Room, and the procedure verified as Laparoscopic Cholecystectomy with Intraoperative Cholangiogram. A Time Out was held and the above information confirmed.  Prior to the induction of general anesthesia, antibiotic prophylaxis was administered. General endotracheal anesthesia was then administered and tolerated well. After the induction, the abdomen was prepped with Chloraprep and draped in the sterile fashion. The patient was positioned in the supine position.  Local anesthetic agent was injected into the skin near the umbilicus and an incision made. We dissected down to the abdominal fascia with blunt dissection.  The fascia was incised vertically and we entered the peritoneal cavity bluntly.  A pursestring suture of 0-Vicryl was placed around the fascial opening.  The Hasson cannula was inserted  and secured with the stay suture.  Pneumoperitoneum was then created with CO2 and tolerated well without any adverse changes in the patient's vital signs.  Unfortunately, there were many adhesions to the abdominal wall.  A 5 mm Optiview port was placed in the LUQ and the adhesions were better visualized.  A 5 mm port was placed in the left mid abdomen. Sharp dissection was used to take down the omentum from the abdominal wall.   Two 5-mm ports were placed in the right upper quadrant. All skin incisions were infiltrated with a local anesthetic agent before making the incision and placing the trocars. The gallbladder also had omental adhesions that were taken down with cautery.  We positioned the patient in reverse Trendelenburg, tilted slightly to the patient's left.  The gallbladder was identified, the fundus grasped and retracted cephalad. Adhesions were lysed bluntly and with the electrocautery where indicated, taking care not to injure any adjacent organs or viscus. The infundibulum was grasped and retracted laterally, exposing the peritoneum overlying the triangle of Calot. This was then divided and exposed in a blunt fashion. A critical view of the cystic duct and cystic artery was obtained.  The cystic duct was clearly identified and bluntly dissected circumferentially. The cystic duct was ligated with a clip distally.   An incision was made in the cystic duct and the [facility] cholangiogram catheter introduced. The catheter was secured using a clip. A cholangiogram was then performed, demonstrating filling of cystic duct, common bile duct, left and right hepatic ducts, and duodenum.  The cystic duct was then ligated with clips and divided. The cystic artery was identified, dissected free, ligated with clips and divided as well.   The gallbladder was dissected from the liver bed in retrograde fashion with the electrocautery. The gallbladder was removed and placed in an Endocatch bag.  The gallbladder and  Endocatch bag were then removed through the umbilical port site.  The liver bed was irrigated and inspected. Hemostasis was achieved with the electrocautery. Copious irrigation was utilized and was repeatedly aspirated until clear.    We again inspected the right upper quadrant for hemostasis.  Pneumoperitoneum was released as we removed the trocars.   The pursestring suture was used to close the umbilical fascia.  4-0 Monocryl was used to close the skin.   The skin was cleaned and dry, and Dermabond was applied. The patient was then extubated and brought to the recovery room in stable condition. Instrument, sponge, and needle counts were correct at closure and at the conclusion of the case.   Findings: Significant burden of adhesions to abdominal wall.  Acutely inflamed gallbladder.    Estimated Blood Loss: 50 mL         Drains: none.            Specimens: Gallbladder to pathology       Complications: None; patient tolerated the procedure well.         Disposition: PACU - hemodynamically stable.         Condition: stable

## 2017-05-24 NOTE — Interval H&P Note (Signed)
History and Physical Interval Note:  05/24/2017 8:27 AM  Lindsey Fisher  has presented today for surgery, with the diagnosis of CHOLECYSTITIS  The various methods of treatment have been discussed with the patient and family. After consideration of risks, benefits and other options for treatment, the patient has consented to  Procedure(s): LAPAROSCOPIC CHOLECYSTECTOMY WITH INTRAOPERATIVE CHOLANGIOGRAM (N/A) as a surgical intervention .  The patient's history has been reviewed, patient examined, no change in status, stable for surgery.  I have reviewed the patient's chart and labs.  Questions were answered to the patient's satisfaction.     Alacia Rehmann

## 2017-05-24 NOTE — Discharge Instructions (Signed)

## 2017-05-24 NOTE — Anesthesia Postprocedure Evaluation (Signed)
Anesthesia Post Note  Patient: Lindsey Fisher  Procedure(s) Performed: Procedure(s) (LRB): LAPAROSCOPIC CHOLECYSTECTOMY WITH INTRAOPERATIVE CHOLANGIOGRAM (N/A)     Patient location during evaluation: PACU Anesthesia Type: General Level of consciousness: awake and alert Pain management: pain level controlled Vital Signs Assessment: post-procedure vital signs reviewed and stable Respiratory status: spontaneous breathing, nonlabored ventilation and respiratory function stable Cardiovascular status: blood pressure returned to baseline and stable Postop Assessment: no signs of nausea or vomiting Anesthetic complications: no    Last Vitals:  Vitals:   05/24/17 1315 05/24/17 1330  BP:  (!) 148/76  Pulse:  62  Resp:    Temp: (!) 36.2 C   SpO2:  96%    Last Pain:  Vitals:   05/24/17 1315  TempSrc:   PainSc: 8                  Lowella Curb

## 2017-05-24 NOTE — Anesthesia Procedure Notes (Signed)
Procedure Name: Intubation Date/Time: 05/24/2017 10:26 AM Performed by: Annabelle Harman A Pre-anesthesia Checklist: Patient identified, Emergency Drugs available, Suction available and Patient being monitored Patient Re-evaluated:Patient Re-evaluated prior to induction Oxygen Delivery Method: Circle system utilized Preoxygenation: Pre-oxygenation with 100% oxygen Induction Type: IV induction Ventilation: Mask ventilation without difficulty and Oral airway inserted - appropriate to patient size Laryngoscope Size: Miller and 3 Grade View: Grade II Tube type: Oral Tube size: 7.0 mm Number of attempts: 1 Airway Equipment and Method: Stylet Placement Confirmation: ETT inserted through vocal cords under direct vision,  positive ETCO2 and breath sounds checked- equal and bilateral Secured at: 21 cm Tube secured with: Tape Dental Injury: Teeth and Oropharynx as per pre-operative assessment  Comments: Intubation performed by Harvie Heck, paramedic, with CRNA and MD at bedside.

## 2017-05-24 NOTE — Anesthesia Preprocedure Evaluation (Addendum)
Anesthesia Evaluation    Airway Mallampati: II  TM Distance: >3 FB Neck ROM: Full    Dental no notable dental hx.    Pulmonary    Pulmonary exam normal breath sounds clear to auscultation       Cardiovascular hypertension, + CAD  Normal cardiovascular exam Rhythm:Regular Rate:Normal     Neuro/Psych    GI/Hepatic   Endo/Other  Morbid obesity  Renal/GU      Musculoskeletal   Abdominal   Peds  Hematology   Anesthesia Other Findings Cholecystitis  Reproductive/Obstetrics                            Anesthesia Physical Anesthesia Plan  ASA: III  Anesthesia Plan: General   Post-op Pain Management:    Induction: Intravenous  PONV Risk Score and Plan: 3 and Ondansetron, Dexamethasone, Midazolam and Treatment may vary due to age or medical condition  Airway Management Planned: Oral ETT  Additional Equipment:   Intra-op Plan:   Post-operative Plan: Extubation in OR  Informed Consent: I have reviewed the patients History and Physical, chart, labs and discussed the procedure including the risks, benefits and alternatives for the proposed anesthesia with the patient or authorized representative who has indicated his/her understanding and acceptance.   Dental advisory given  Plan Discussed with: CRNA  Anesthesia Plan Comments:         Anesthesia Quick Evaluation

## 2017-05-25 ENCOUNTER — Encounter (HOSPITAL_COMMUNITY): Payer: Self-pay | Admitting: General Surgery

## 2017-05-25 LAB — COMPREHENSIVE METABOLIC PANEL
ALT: 103 U/L — ABNORMAL HIGH (ref 14–54)
AST: 67 U/L — AB (ref 15–41)
Albumin: 3.5 g/dL (ref 3.5–5.0)
Alkaline Phosphatase: 111 U/L (ref 38–126)
Anion gap: 8 (ref 5–15)
BILIRUBIN TOTAL: 0.7 mg/dL (ref 0.3–1.2)
BUN: 5 mg/dL — AB (ref 6–20)
CHLORIDE: 106 mmol/L (ref 101–111)
CO2: 25 mmol/L (ref 22–32)
Calcium: 9 mg/dL (ref 8.9–10.3)
Creatinine, Ser: 0.78 mg/dL (ref 0.44–1.00)
Glucose, Bld: 119 mg/dL — ABNORMAL HIGH (ref 65–99)
POTASSIUM: 3.9 mmol/L (ref 3.5–5.1)
Sodium: 139 mmol/L (ref 135–145)
TOTAL PROTEIN: 6.7 g/dL (ref 6.5–8.1)

## 2017-05-25 LAB — CBC
HEMATOCRIT: 32 % — AB (ref 36.0–46.0)
Hemoglobin: 10.5 g/dL — ABNORMAL LOW (ref 12.0–15.0)
MCH: 28.5 pg (ref 26.0–34.0)
MCHC: 32.8 g/dL (ref 30.0–36.0)
MCV: 87 fL (ref 78.0–100.0)
PLATELETS: 247 10*3/uL (ref 150–400)
RBC: 3.68 MIL/uL — ABNORMAL LOW (ref 3.87–5.11)
RDW: 15.7 % — AB (ref 11.5–15.5)
WBC: 13 10*3/uL — ABNORMAL HIGH (ref 4.0–10.5)

## 2017-05-25 MED ORDER — OXYCODONE HCL 5 MG PO TABS: 5.0000 mg | ORAL_TABLET | Freq: Three times a day (TID) | ORAL | 0 refills | Status: AC | PRN

## 2017-05-25 MED ORDER — HYDROCODONE-ACETAMINOPHEN 5-325 MG PO TABS: 2.0000 | ORAL_TABLET | Freq: Once | ORAL | Status: DC

## 2017-05-25 MED ORDER — FAMOTIDINE 20 MG PO TABS
20.0000 mg | ORAL_TABLET | Freq: Two times a day (BID) | ORAL | Status: DC
  Administered 2017-05-25 – 2017-05-26 (×3): 20 mg via ORAL
  Filled 2017-05-25 (×3): qty 1

## 2017-05-25 MED ORDER — FENTANYL CITRATE (PF) 100 MCG/2ML IJ SOLN: 25.0000 ug | INTRAMUSCULAR | Status: DC | PRN

## 2017-05-25 MED ORDER — IBUPROFEN 200 MG PO TABS: 400.0000 mg | ORAL_TABLET | Freq: Four times a day (QID) | ORAL | Status: DC | PRN

## 2017-05-25 MED ORDER — SENNOSIDES-DOCUSATE SODIUM 8.6-50 MG PO TABS
1.0000 | ORAL_TABLET | Freq: Two times a day (BID) | ORAL | Status: DC
  Filled 2017-05-25: qty 1

## 2017-05-25 MED ORDER — DOCUSATE SODIUM 100 MG PO CAPS
100.0000 mg | ORAL_CAPSULE | Freq: Two times a day (BID) | ORAL | Status: DC
  Administered 2017-05-25 – 2017-05-26 (×3): 100 mg via ORAL
  Filled 2017-05-25 (×3): qty 1

## 2017-05-25 MED ORDER — HYDROCODONE-ACETAMINOPHEN 5-325 MG PO TABS: 1.0000 | ORAL_TABLET | Freq: Four times a day (QID) | ORAL | Status: DC | PRN

## 2017-05-25 MED ORDER — BISACODYL 5 MG PO TBEC
10.0000 mg | DELAYED_RELEASE_TABLET | Freq: Every day | ORAL | Status: DC | PRN
  Administered 2017-05-25: 10 mg via ORAL
  Filled 2017-05-25: qty 2

## 2017-05-25 MED ORDER — OXYCODONE HCL 5 MG PO TABS
5.0000 mg | ORAL_TABLET | ORAL | Status: DC | PRN
  Administered 2017-05-25 – 2017-05-26 (×5): 5 mg via ORAL
  Filled 2017-05-25 (×6): qty 1

## 2017-05-25 MED ORDER — SIMETHICONE 80 MG PO CHEW
160.0000 mg | CHEWABLE_TABLET | Freq: Four times a day (QID) | ORAL | Status: DC | PRN
  Administered 2017-05-26: 160 mg via ORAL
  Filled 2017-05-25: qty 2

## 2017-05-25 MED ORDER — DOCUSATE SODIUM 100 MG PO CAPS: 100.0000 mg | ORAL_CAPSULE | Freq: Two times a day (BID) | ORAL | 0 refills | Status: AC

## 2017-05-25 NOTE — Progress Notes (Signed)
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 Surgery requested a medical admission with HIDA scan for further evaluation. D-dimer was elevated to 1.58 and CTAP study is negative for PE or acute cardiac pulmonary disease. Right upper quadrant ultrasound reveals a contracted gallbladder with wall thickening, stones, possible sludge, and findings possibly reflective of acute and/or chronic cholecystitis. Planning surgery later today after being clear by cardiology service.   Assessment/Plan: 1-acute cholecystitis  -s/p cholecystectomy  -per CCS ok to discontinue zosyn; will follow post operative rec's -patient nauseated, even not vomiting -no flatus and no BM's yet -planning to advance diet further and increase physical activity -pain meds transitioned to PO -hopefully home in am   2-CAD/chest discomfort -positive stress test, but neg cath for acute ischemic component -patient cleared for surgery by cardiology  -no CP at this moment and no SOB -will continue medical management for CAD -symptoms most likely due to GERD and/or gallbladder process; will continue PPI -patient also status post cholecystectomy   3-obesity -Body mass index is 40.66 kg/m. -low calorie diet and exercise discussed with patient   4-elevated liver function  -LFT's trending down appropriately  -most likely has passed a gallstone at some point -S/P cholecystectomy and intra-operative cholangiogram on 9/5 -will repeat CMET in am -appreciate CCS intervention and rec's  5-HTN -stable and well controlled -will continue current antihypertensive drugs  6-HLD -will resume statins at  discharge once LFT's further down and fully tolerating PO   Code Status: Full Family Communication: no family at bedside  Disposition Plan: S/P cholecystectomy on 9/5; will follow clinical response and post operative recovery. Advancing diet and transitioning meds to PO   Consultants:  Cardiology   General surgery   Procedures:  stress test 9/2  Cardiac cath 9/4  cholecystectomy later today 9/5  Antibiotics:  Zosyn 8/31  HPI/Subjective: Afebrile, no CP, no SOB, no vomiting. Patient feeling nauseated, no flatus or BM  Objective: Vitals:   05/25/17 0434 05/25/17 1336  BP: (!) 154/71 (!) 108/57  Pulse: 72 62  Resp: 16 15  Temp: 98.2 F (36.8 C) 98.3 F (36.8 C)  SpO2: 100% 100%    Intake/Output Summary (Last 24 hours) at 05/25/17 1835 Last data filed at 05/25/17 1100  Gross per 24 hour  Intake              468 ml  Output             1350 ml  Net             -882 ml   Filed Weights   05/22/17 0612 05/24/17 0521 05/25/17 0434  Weight: 96.6 kg (213 lb) 97.3 kg (214 lb 8 oz) 97.6 kg (215 lb 3.2 oz)    Exam:   General: afebrile, no CP, no SOB. Mild to moderate abd discomfort, but this time generalized pain (reproting stabbing pain). Positive nausea, no vomiting. No BM's, no flatus  Cardiovascular: S1 and S2, no rubs, no gallops, no JVD  Respiratory: CTA bilaterally    Abdomen: generalized abd pain, positive BS, no guarding    Musculoskeletal: no edema, no cyanosis   Data Reviewed: Basic Metabolic Panel:  Recent Labs Lab 05/19/17 0146 05/19/17 1321 05/20/17 0319 05/21/17 0303 05/25/17  0339  NA 140 142 139 140 139  K 2.9* 3.6 3.3* 4.0 3.9  CL 105 106 106 106 106  CO2 GLUCOSE 99 88 93 96 119*  BUN 5* 5*  CREATININE 0.83 0.77 0.88 0.89 0.78  CALCIUM 8.7* 8.9 8.6* 8.8* 9.0   Liver Function Tests:  Recent Labs Lab 05/19/17 0146 05/20/17 0637 05/21/17 0303 05/25/17 0339  AST 205* 67* 33 67*  ALT 254* 156* 109* 103*   ALKPHOS 212* 177* 156* 111  BILITOT 1.8* 1.3* 1.0 0.7  PROT 6.7 6.3* 6.6 6.7  ALBUMIN 3.7 3.3* 3.3* 3.5    Recent Labs Lab 05/19/17 0146  LIPASE 30   CBC:  Recent Labs Lab 05/19/17 0146 05/25/17 0339  WBC 6.2 13.0*  NEUTROABS 4.8  --   HGB 11.0* 10.5*  HCT 33.8* 32.0*  MCV 86.9 87.0  PLT 237 247   Cardiac Enzymes:  Recent Labs Lab 05/18/17 2003 05/19/17 0146  TROPONINI <0.03 <0.03   CBG:  Recent Labs Lab 05/20/17 0752 05/21/17 0727 05/22/17 0611 05/22/17 0740 05/22/17 2153  GLUCAP 95 80 99 100* 90    Recent Results (from the past 240 hour(s))  Surgical pcr screen     Status: None   Collection Time: 05/24/17  9:50 AM  Result Value Ref Range Status   MRSA, PCR NEGATIVE NEGATIVE Final   Staphylococcus aureus NEGATIVE NEGATIVE Final    Comment: (NOTE) The Xpert SA Assay (FDA approved for NASAL specimens in patients 81 years of age and older), is one component of a comprehensive surveillance program. It is not intended to diagnose infection nor to guide or monitor treatment.      Studies: Dg Cholangiogram Operative  Result Date: 05/24/2017 CLINICAL DATA:  Intraoperative cholangiogram during laparoscopic cholecystectomy. EXAM: INTRAOPERATIVE CHOLANGIOGRAM FLUOROSCOPY TIME:  26 seconds COMPARISON:  Nuclear medicine HIDA scan - 05/19/2017 FINDINGS: Intraoperative cholangiographic images of the right upper abdominal quadrant during laparoscopic cholecystectomy are provided for review. Surgical clips overlie the expected location of the gallbladder fossa. Contrast injection demonstrates selective cannulation of the central aspect of the cystic duct. There is passage of contrast through the central aspect of the cystic duct with filling of a non dilated common bile duct. There is passage of contrast though the CBD and into the descending portion of the duodenum. There is minimal reflux of injected contrast into the common hepatic duct and central aspect of the non  dilated intrahepatic biliary system. Transient nonocclusive filling defect within the mid and central aspect of the common bile duct is favored to represent an air bubble. No definitive persistent filling defects within the opacified portions of the biliary system to suggest the presence of choledocholithiasis. IMPRESSION: No evidence of choledocholithiasis. Electronically Signed   By: Simonne Come M.D.   On: 05/24/2017 11:58    Scheduled Meds: . amLODipine  5 mg Oral Daily  . atenolol  25 mg Oral Daily  . atorvastatin  40 mg Oral q1800  . docusate sodium  100 mg Oral BID  . famotidine  20 mg Oral BID  . sodium chloride flush  3 mL Intravenous Q12H  . sodium chloride flush  3 mL Intravenous Q12H   Continuous Infusions: . sodium chloride      Principal Problem:   Chest discomfort Active Problems:   Elevated LFTs   Cholelithiasis   CAD (coronary artery disease)   Personal history of DVT (deep vein thrombosis)   Essential hypertension  Nausea & vomiting    Time spent: 25 minutes    Vassie Loll  [facility] Pager [phone]. If 7PM-7AM, please contact night-coverage at [email or web address], password [facility] 05/25/2017, 6:35 PM  LOS: 5 days

## 2017-05-25 NOTE — Plan of Care (Signed)
Problem: Pain Managment: Goal: General experience of comfort will improve Outcome: Progressing Verbalized understanding  Problem: Activity: Goal: Risk for activity intolerance will decrease Outcome: Progressing Verbalized understanding

## 2017-05-25 NOTE — Progress Notes (Signed)
   Please call if further cardiac help is needed.

## 2017-05-25 NOTE — Progress Notes (Signed)
Patient complaining of feeling bloated and gas pains.  No PRNs noted for gas.  RN text paged [facility] with this information.

## 2017-05-25 NOTE — Progress Notes (Signed)
1 Day Post-Op   Subjective/Chief Complaint: Abdomen is sore but improved pain compared to preop. Tolerating clears   Objective: Vital signs in last 24 hours: Temp:  [97.2 F (36.2 C)-98.4 F (36.9 C)] 98.2 F (36.8 C) (09/06 0434) Pulse Rate:  [57-72] 72 (09/06 0434) Resp:  [8-16] 16 (09/06 0434) BP: (133-191)/(71-103) 154/71 (09/06 0434) SpO2:  [95 %-100 %] 100 % (09/06 0434) Weight:  [97.6 kg (215 lb 3.2 oz)] 97.6 kg (215 lb 3.2 oz) (09/06 0434) Last BM Date: 05/21/17  Intake/Output from previous day: 09/05 0701 - 09/06 0700 In: 1206 [I.V.:1206] Out: 1370 [Urine:1350; Blood:20] Intake/Output this shift: No intake/output data recorded.  General appearance: alert and cooperative Resp: unlabored GI: soft, tender around incisions which are c/d/i with dermabond Incision/Wound: c/d/i  Lab Results:   Recent Labs  05/25/17 0339  WBC 13.0*  HGB 10.5*  HCT 32.0*  PLT 247   BMET  Recent Labs  05/25/17 0339  NA 139  K 3.9  CL 106  CO2 25  GLUCOSE 119*  BUN 5*  CREATININE 0.78  CALCIUM 9.0   PT/INR No results for input(s): LABPROT, INR in the last 72 hours. ABG No results for input(s): PHART, HCO3 in the last 72 hours.  Invalid input(s): PCO2, PO2  Studies/Results: Dg Cholangiogram Operative  Result Date: 05/24/2017 CLINICAL DATA:  Intraoperative cholangiogram during laparoscopic cholecystectomy. EXAM: INTRAOPERATIVE CHOLANGIOGRAM FLUOROSCOPY TIME:  26 seconds COMPARISON:  Nuclear medicine HIDA scan - 05/19/2017 FINDINGS: Intraoperative cholangiographic images of the right upper abdominal quadrant during laparoscopic cholecystectomy are provided for review. Surgical clips overlie the expected location of the gallbladder fossa. Contrast injection demonstrates selective cannulation of the central aspect of the cystic duct. There is passage of contrast through the central aspect of the cystic duct with filling of a non dilated common bile duct. There is passage of  contrast though the CBD and into the descending portion of the duodenum. There is minimal reflux of injected contrast into the common hepatic duct and central aspect of the non dilated intrahepatic biliary system. Transient nonocclusive filling defect within the mid and central aspect of the common bile duct is favored to represent an air bubble. No definitive persistent filling defects within the opacified portions of the biliary system to suggest the presence of choledocholithiasis. IMPRESSION: No evidence of choledocholithiasis. Electronically Signed   By: Simonne Come M.D.   On: 05/24/2017 11:58    Anti-infectives: Anti-infectives    Start     Dose/Rate Route Frequency Ordered Stop   05/24/17 1400  piperacillin-tazobactam (ZOSYN) IVPB 3.375 g     3.375 g 12.5 mL/hr over 240 Minutes Intravenous Every 8 hours 05/24/17 1332 05/24/17 1813   05/19/17 1400  piperacillin-tazobactam (ZOSYN) IVPB 3.375 g  Status:  Discontinued     3.375 g 12.5 mL/hr over 240 Minutes Intravenous Every 8 hours 05/19/17 1332 05/24/17 1332      Assessment/Plan: s/p Procedure(s): LAPAROSCOPIC CHOLECYSTECTOMY WITH INTRAOPERATIVE CHOLANGIOGRAM (N/A) Advance diet, wean off IV pain meds. If tolerating diet and pain controlled OK for DC from gen surg standpoint. Rxs in chart.  LOS: 5 days    Lindsey Fisher 05/25/2017

## 2017-05-26 DIAGNOSIS — E66813 Obesity, class 3: Secondary | ICD-10-CM

## 2017-05-26 LAB — GLUCOSE, CAPILLARY: GLUCOSE-CAPILLARY: 94 mg/dL (ref 65–99)

## 2017-05-26 MED ORDER — FAMOTIDINE 20 MG PO TABS: 20.0000 mg | ORAL_TABLET | Freq: Two times a day (BID) | ORAL | 1 refills | Status: AC

## 2017-05-26 MED ORDER — POLYETHYLENE GLYCOL 3350 17 G PO PACK
17.0000 g | PACK | Freq: Every day | ORAL | Status: DC
  Administered 2017-05-26: 17 g via ORAL
  Filled 2017-05-26: qty 1

## 2017-05-26 NOTE — Care Management Note (Signed)
Case Management Note  Patient Details  Name: Lorenna Gurman MRN: [id number] Date of Birth: [birth year]  Subjective/Objective:  Pt presented for Chest Pain- post cardiac cath. Pt with symptomatic Cholelithiasis s/p Laparoscopic Cholecysectomy. Pt's diet has been advanced and she is tolerating it well. Plan for home 05-26-17.                    Action/Plan: No Home needs identified. CM previously had provided pt with the [facility] Number for finding PCP that is in network with her insurance. CM did reach out to [facility] Case Manager Sheilah Pigeon in regards to PCP needs/ suggestions as well. No further needs from CM at this time.   Expected Discharge Date:                  Expected Discharge Plan:  Home/Self Care  In-House Referral:  NA  Discharge planning Services  CM Consult  Post Acute Care Choice:  NA Choice offered to:  NA  DME Arranged:  N/A DME Agency:  NA  HH Arranged:  NA HH Agency:  NA  Status of Service:  Completed, signed off  If discussed at Long Length of Stay Meetings, dates discussed:    Additional Comments:  Gala Lewandowsky, RN 05/26/2017, 11:49 AM

## 2017-05-26 NOTE — Plan of Care (Signed)
Problem: Activity: Goal: Risk for activity intolerance will decrease Outcome: Progressing Patient ambulated about 300 feet this evening with stand-by assist per nurse tech.  Patient tolerated well.

## 2017-05-26 NOTE — Discharge Summary (Signed)
Physician Discharge Summary  Lindsey Fisher RJJ:[id number] DOB: 1960-11-23 DOA: 05/18/2017  PCP: Benito Mccreedy, MD  Admit date: 05/18/2017 Discharge date: 05/26/2017  Time spent: 35 minutes  Recommendations for Outpatient Follow-up:  1. Repeat CMET to follow-up electrolytes, liver function and renal function 2. Repeat CBC to follow hemoglobin and white blood cells trend.   Discharge Diagnoses:  Principal Problem:   Chest discomfort Active Problems:   Elevated LFTs   Cholelithiasis   CAD (coronary artery disease)   Personal history of DVT (deep vein thrombosis)   Essential hypertension   Nausea & vomiting   Obesity, Class III, BMI 40-49.9 (morbid obesity) ([place])   Discharge Condition: Stable and improved. Patient discharged home with instructions to follow up with PCP and with general surgery service as an outpatient.  Diet recommendation: Heart healthy and low calorie diet  Filed Weights   05/24/17 0521 05/25/17 0434 05/26/17 0631  Weight: 97.3 kg (214 lb 8 oz) 97.6 kg (215 lb 3.2 oz) 96.9 kg (213 lb 11.2 oz)    History of present illness:  As per 56 y.o. 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 Patient reports pain in her usual state of health and having an uneventful day yesterday, going out for dinner last night for fried fish. Couple hours after returning home, she noted the acute onset of central chest discomfort with nausea and recurrent nonbloody vomiting. She denies any associated fevers, chills, shortness of breath, cough, or diaphoresis. She has experienced some fleeting chest pressure in the past, but never with the severe nausea and vomiting. Symptoms persisted unchanged, prompting her presentation to the ED today.  &P written by Dr. Myna Hidalgo on a 05/18/17   Hospital Course:  1-acute  cholecystitis  -s/p cholecystectomy  -per CCS ok to discontinue Antibiotics and to discharge home with outpatient follow-up.  -No nausea, no vomiting, passing flatness.  -pain meds transitioned to PO And patient tolerating full diet. Patient has been discharged home with instructions to follow up on 9/20 with general surgery as an outpatient.-  2-CAD/chest discomfort -positive stress test, but neg cath for acute ischemic component. -patient cleared for surgery by cardiology  -no CP Or shortness of breath at discharge.  -will continue medical management for her history of CAD -Patient acute chest discomfort symptoms most likely due to GERD and/or gallbladder process. -will discharge on famotidine  -patient also status post cholecystectomy   3-obesity -Body mass index is 40.66 kg/m. -low calorie diet and exercise discussed with patient   4-elevated liver function  -Will recommend repeat CMET at follow-up to reassess liver function tests. -most likely has passed a gallstone at some point -S/P cholecystectomy and intra-operative cholangiogram on 9/5 -AST and ALT mildly elevated; alk phos and bilirubin WNL -appreciate CCS intervention and rec's  5-HTN -stable and well controlled -will continue current antihypertensive drugs -patient advise to follow low sodium diet   6-HLD -will resume statins at discharge  -will recommend  Follow up patient's LFT's  Procedures:  stress test 9/2  Cardiac cath 9/4  cholecystectomy later today 9/5  Consultations:   Cardiology     Gen surgery   Discharge Exam: Vitals:   05/26/17 1032 05/26/17 1412  BP: (!) 144/88 135/72  Pulse:    Resp:    Temp:  98.7 F (37.1 C)  SpO2:      General: afebrile,  no CP, no SOB. Patient tolerating diet and having just mild to moderate abd pain. Passing flatus. No vomiting.  Cardiovascular: S1 and S2, no rubs, no gallops, no JVD  Respiratory: CTA bilaterally    Abdomen: generalized abd  pain, positive BS, no guarding    Musculoskeletal: no edema, no cyanosis    Discharge Instructions   Discharge Instructions    Diet - low sodium heart healthy    Complete by:  As directed    Discharge instructions    Complete by:  As directed    Take medications as prescribed Follow-up with general surgery service as instructed Keep herself well-hydrated Follow a heart healthy/low fat and low calorie diet.     Current Discharge Medication List    START taking these medications   Details  docusate sodium (COLACE) 100 MG capsule Take 1 capsule (100 mg total) by mouth 2 (two) times daily. Qty: 60 capsule, Refills: 0    famotidine (PEPCID) 20 MG tablet Take 1 tablet (20 mg total) by mouth 2 (two) times daily. Qty: 60 tablet, Refills: 1    oxyCODONE (ROXICODONE) 5 MG immediate release tablet Take 1 tablet (5 mg total) by mouth every 8 (eight) hours as needed. Qty: 20 tablet, Refills: 0      CONTINUE these medications which have NOT CHANGED   Details  albuterol (PROVENTIL HFA;VENTOLIN HFA) 108 (90 Base) MCG/ACT inhaler Inhale into the lungs every 6 (six) hours as needed for wheezing or shortness of breath.    amLODipine (NORVASC) 5 MG tablet Take 5 mg by mouth daily.    atenolol (TENORMIN) 25 MG tablet Take by mouth daily.    atorvastatin (LIPITOR) 40 MG tablet Take 40 mg by mouth at bedtime.     clopidogrel (PLAVIX) 75 MG tablet Take 75 mg by mouth daily.    hydrochlorothiazide (HYDRODIURIL) 25 MG tablet Take 25 mg by mouth daily.    ibuprofen (ADVIL,MOTRIN) 400 MG tablet Take 1 tablet (400 mg total) by mouth every 6 (six) hours as needed. Qty: 30 tablet, Refills: 0       Allergies  Allergen Reactions  . Ace Inhibitors    Follow-up Information    [facility]  Follow up on 06/08/2017.   Specialty:  General Surgery Why:  Your appointment is at 2:45 PM, bring your photo ID and insurance information.  Be at the office 30 minutes early for check in.    Contact information: [address] [place] [address] [phone]        Benito Mccreedy, MD. Schedule an appointment as soon as possible for a visit in 2 week(s).   Specialty:  Internal Medicine Contact information: [address] [place]  [address] [phone]            The results of significant diagnostics from this hospitalization (including imaging, microbiology, ancillary and laboratory) are listed below for reference.    Significant Diagnostic Studies: Dg Chest 2 View  Result Date: 05/18/2017 CLINICAL DATA:  56 year old female with heart palpitations and vomiting with centralized chest pressure. EXAM: CHEST  2 VIEW COMPARISON:  None. FINDINGS: The heart size and mediastinal contours are within normal limits. Both lungs are clear. The visualized skeletal structures are unremarkable. IMPRESSION: No active cardiopulmonary disease. Electronically Signed   By: Kristopher Oppenheim M.D.   On: 05/18/2017 09:39   Dg Cholangiogram Operative  Result Date: 05/24/2017 CLINICAL DATA:  Intraoperative cholangiogram during laparoscopic cholecystectomy. EXAM: INTRAOPERATIVE CHOLANGIOGRAM FLUOROSCOPY TIME:  26  seconds COMPARISON:  Nuclear medicine HIDA scan - 05/19/2017 FINDINGS: Intraoperative cholangiographic images of the right upper abdominal quadrant during laparoscopic cholecystectomy are provided for review. Surgical clips overlie the expected location of the gallbladder fossa. Contrast injection demonstrates selective cannulation of the central aspect of the cystic duct. There is passage of contrast through the central aspect of the cystic duct with filling of a non dilated common bile duct. There is passage of contrast though the CBD and into the descending portion of the duodenum. There is minimal reflux of injected contrast into the common hepatic duct and central aspect of the non dilated intrahepatic biliary system. Transient nonocclusive filling defect  within the mid and central aspect of the common bile duct is favored to represent an air bubble. No definitive persistent filling defects within the opacified portions of the biliary system to suggest the presence of choledocholithiasis. IMPRESSION: No evidence of choledocholithiasis. Electronically Signed   By: Sandi Mariscal M.D.   On: 05/24/2017 11:58   Ct Angio Chest Pe W And/or Wo Contrast  Result Date: 05/18/2017 CLINICAL DATA:  Heart palpitations, central chest pressure. Positive D-dimer. EXAM: CT ANGIOGRAPHY CHEST WITH CONTRAST TECHNIQUE: Multidetector CT imaging of the chest was performed using the standard protocol during bolus administration of intravenous contrast. Multiplanar CT image reconstructions and MIPs were obtained to evaluate the vascular anatomy. CONTRAST:  100 cc Isovue 370 IV COMPARISON:  Chest x-ray today. FINDINGS: Cardiovascular: Coronary artery calcifications in the left anterior descending coronary artery. Heart is normal size. Aorta is normal caliber. No filling defects in the pulmonary arteries to suggest pulmonary emboli. Mediastinum/Nodes: No mediastinal, hilar, or axillary adenopathy. Trachea and esophagus are unremarkable. Thyroid unremarkable. Lungs/Pleura: Lungs are clear. No focal airspace opacities or suspicious nodules. No effusions. Upper Abdomen: Imaging into the upper abdomen shows no acute findings. Musculoskeletal: Chest wall soft tissues are unremarkable. No acute bony abnormality. Review of the MIP images confirms the above findings. IMPRESSION: No evidence of pulmonary embolus. Coronary artery calcifications in the left anterior descending coronary artery. No acute cardiopulmonary disease. Electronically Signed   By: Rolm Baptise M.D.   On: 05/18/2017 10:52   Nm Hepatobiliary Liver Func  Result Date: 05/19/2017 CLINICAL DATA:  Inpatient. Cholelithiasis. Nausea and vomiting. Upper abdominal pain. EXAM: NUCLEAR MEDICINE HEPATOBILIARY IMAGING TECHNIQUE: Sequential  images of the abdomen were obtained out to 60 minutes following intravenous administration of radiopharmaceutical. RADIOPHARMACEUTICALS:  5.2 mCi Tc-56m Choletec IV COMPARISON:  05/18/2017 right upper quadrant abdominal sonogram. FINDINGS: Prompt uptake and biliary excretion of activity by the liver is seen. Gallbladder activity is visualized, consistent with patency of cystic duct. Biliary activity passes into small bowel, consistent with patent common bile duct. IMPRESSION: Normal hepatobiliary scintigraphy study. Gallbladder activity is visualized, indicating patent cystic duct, which is not compatible with acute cholecystitis. Electronically Signed   By: JIlona SorrelM.D.   On: 05/19/2017 20:33   Nm Myocar Multi W/spect W/wall Motion / Ef  Result Date: 05/21/2017 CLINICAL DATA:  Myocardial infarction in 2016. Prior angioplasty and stent. Hypertension. Family history of heart disease. Coronary artery disease. EXAM: MYOCARDIAL IMAGING WITH SPECT (REST AND PHARMACOLOGIC-STRESS) GATED LEFT VENTRICULAR WALL MOTION STUDY LEFT VENTRICULAR EJECTION FRACTION TECHNIQUE: Standard myocardial SPECT imaging was performed after resting intravenous injection of 10 mCi Tc-931metrofosmin. Subsequently, intravenous infusion of Lexiscan was performed under the supervision of the Cardiology staff. At peak effect of the drug, 30 mCi Tc-9910mtrofosmin was injected intravenously and standard myocardial SPECT imaging was performed. Quantitative gated  imaging was also performed to evaluate left ventricular wall motion, and estimate left ventricular ejection fraction. COMPARISON:  None. FINDINGS: Perfusion: Moderate size mild severity region of inducible ischemia in the anterior wall extending from the mid heart to the apex, with about 10% loss of activity. Small apical scar. There is some reverse redistribution in the left ventricular septal wall. Wall Motion: Normal left ventricular wall motion. No left ventricular dilation. Left  Ventricular Ejection Fraction: 63 % End diastolic volume 95 ml End systolic volume 35 ml IMPRESSION: 1. Moderate size mild severity area of inducible ischemia in the anterior wall of the left ventricle. Small scar at the left ventricular apex. No left ventricular dilatation. 2. Normal left ventricular wall motion. 3. Left ventricular ejection fraction 63% 4. Non invasive risk stratification*: Intermediate *2012 Appropriate Use Criteria for Coronary Revascularization Focused Update: J Am Coll Cardiol. 9379;02(4):097-353. http://content.airportbarriers.com.aspx?articleid=[email or web address] Electronically Signed   By: Van Clines M.D.   On: 05/21/2017 10:36   US Abdomen Limited Ruq  Result Date: 05/18/2017 CLINICAL DATA:  Patient with epigastric pain and vomiting. EXAM: ULTRASOUND ABDOMEN LIMITED RIGHT UPPER QUADRANT COMPARISON:  None. FINDINGS: Gallbladder: Gallbladder is contracted with wall thickening measuring up to 5 mm. Stones are demonstrated within the gallbladder lumen. Possible sludge. Common bile duct: Diameter: 4 mm Liver: Liver is increased in echogenicity. No focal lesion is identified. Portal vein is patent on color Doppler imaging with normal direction of blood flow towards the liver. IMPRESSION: Gallbladder is contracted with associated wall thickening. Stones and possible sludge within the gallbladder lumen. Findings are nonspecific however may represent acute and/or chronic cholecystitis. Recommend clinical/ laboratory correlation. HIDA scan could be performed as clinically indicated. Hepatic steatosis. Electronically Signed   By: Lovey Newcomer M.D.   On: 05/18/2017 11:46    Microbiology: Recent Results (from the past 240 hour(s))  Surgical pcr screen     Status: None   Collection Time: 05/24/17  9:50 AM  Result Value Ref Range Status   MRSA, PCR NEGATIVE NEGATIVE Final   Staphylococcus aureus NEGATIVE NEGATIVE Final    Comment: (NOTE) The Xpert SA Assay (FDA approved for NASAL specimens  in patients 59 years of age and older), is one component of a comprehensive surveillance program. It is not intended to diagnose infection nor to guide or monitor treatment.      Labs: Basic Metabolic Panel:  Recent Labs Lab 05/20/17 0319 05/21/17 0303 05/25/17 0339  NA 139 140 139  K 3.3* 4.0 3.9  CL 106 106 106  CO2 24 27 25   GLUCOSE 93 96 119*  BUN 7 5* 5*  CREATININE 0.88 0.89 0.78  CALCIUM 8.6* 8.8* 9.0   Liver Function Tests:  Recent Labs Lab 05/20/17 0637 05/21/17 0303 05/25/17 0339  AST 67* 33 67*  ALT 156* 109* 103*  ALKPHOS 177* 156* 111  BILITOT 1.3* 1.0 0.7  PROT 6.3* 6.6 6.7  ALBUMIN 3.3* 3.3* 3.5   CBC:  Recent Labs Lab 05/25/17 0339  WBC 13.0*  HGB 10.5*  HCT 32.0*  MCV 87.0  PLT 247   CBG:  Recent Labs Lab 05/21/17 0727 05/22/17 0611 05/22/17 0740 05/22/17 2153 05/26/17 0749  GLUCAP 80 99 100* 90 94     Signed:  Barton Dubois MD.  [facility] 05/26/2017, 4:16 PM

## 2017-05-26 NOTE — Progress Notes (Signed)
2 Days Post-Op    HA:FBXUX pressure, nausea and vomiting  Subjective: Today big issue is constipation.  Sites look fine and she is doing well with diet and walking.    Objective: Vital signs in last 24 hours: Temp:  [98.2 F (36.8 C)-98.5 F (36.9 C)] 98.2 F (36.8 C) (09/07 0631) Pulse Rate:  [60-65] 65 (09/07 0631) Resp:  [15-17] 17 (09/07 0631) BP: (108-144)/(57-88) 144/88 (09/07 1032) SpO2:  [97 %-100 %] 98 % (09/07 0631) Weight:  [96.9 kg (213 lb 11.2 oz)] 96.9 kg (213 lb 11.2 oz) (09/07 0631) Last BM Date: 05/21/17 1062 PO 600urine Afebrile vital signs are stable No labs today IOC 05/24/17:No evidence of choledocholithiasis. Intake/Output from previous day: 09/06 0701 - 09/07 0700 In: 1065 [P.O.:1062; I.V.:3] Out: 600 [Urine:600] Intake/Output this shift: Total I/O In: 500 [P.O.:500] Out: -   General appearance: alert, cooperative and no distress Resp: clear to auscultation bilaterally GI: soft, sore, tolerating diet, constipated, + bS, sites all look good.  Lab Results:   Recent Labs  05/25/17 0339  WBC 13.0*  HGB 10.5*  HCT 32.0*  PLT 247    BMET  Recent Labs  05/25/17 0339  NA 139  K 3.9  CL 106  CO2 25  GLUCOSE 119*  BUN 5*  CREATININE 0.78  CALCIUM 9.0   PT/INR No results for input(s): LABPROT, INR in the last 72 hours.   Recent Labs Lab 05/20/17 0637 05/21/17 0303 05/25/17 0339  AST 67* 33 67*  ALT 156* 109* 103*  ALKPHOS 177* 156* 111  BILITOT 1.3* 1.0 0.7  PROT 6.3* 6.6 6.7  ALBUMIN 3.3* 3.3* 3.5     Lipase     Component Value Date/Time   LIPASE 30 05/19/2017 0146     Medications: . amLODipine  5 mg Oral Daily  . atenolol  25 mg Oral Daily  . atorvastatin  40 mg Oral q1800  . docusate sodium  100 mg Oral BID  . famotidine  20 mg Oral BID  . sodium chloride flush  3 mL Intravenous Q12H  . sodium chloride flush  3 mL Intravenous Q12H   Anti-infectives    Start     Dose/Rate Route Frequency Ordered Stop   05/24/17 1400  piperacillin-tazobactam (ZOSYN) IVPB 3.375 g     3.375 g 12.5 mL/hr over 240 Minutes Intravenous Every 8 hours 05/24/17 1332 05/24/17 1813   05/19/17 1400  piperacillin-tazobactam (ZOSYN) IVPB 3.375 g  Status:  Discontinued     3.375 g 12.5 mL/hr over 240 Minutes Intravenous Every 8 hours 05/19/17 1332 05/24/17 1332      Assessment/Plan Symptomatic Cholelithiasis, possible chronicCholecystitis Status post laparoscopic cholecystectomy with IOC, 05/24/17 Dr. Almond Lint HTN Coronary artery disease with LAD DES- on chronic ASA and plavix-Cardiac cath: patent LAD stent/no significant LAD DZ Obesity  FEN - heart healthy,  VTE - SCDs, heparin, holdingplavix  ID- Zosyn (8/31-05/24/17) DVT:  SCD    Plan:  Home from our standpoint, I will give her some Miralax if she has not had any already.  We will see her in the office.  Follow up instructions are in the AVS.    LOS: 6 days    Lindsey Fisher 05/26/2017 [phone]

## 2019-04-09 IMAGING — RF DG CHOLANGIOGRAM OPERATIVE
1 series · 8 of 8 positions shown · non-contrast
Comparison: Nuclear medicine HIDA scan - 05/19/2017

CLINICAL DATA: Intraoperative cholangiogram during laparoscopic
cholecystectomy.

EXAM:
INTRAOPERATIVE CHOLANGIOGRAM
FLUOROSCOPY TIME:  26 seconds

[Series 1: run · 2 acquisitions, 8 frames shown]
[im 1/2]
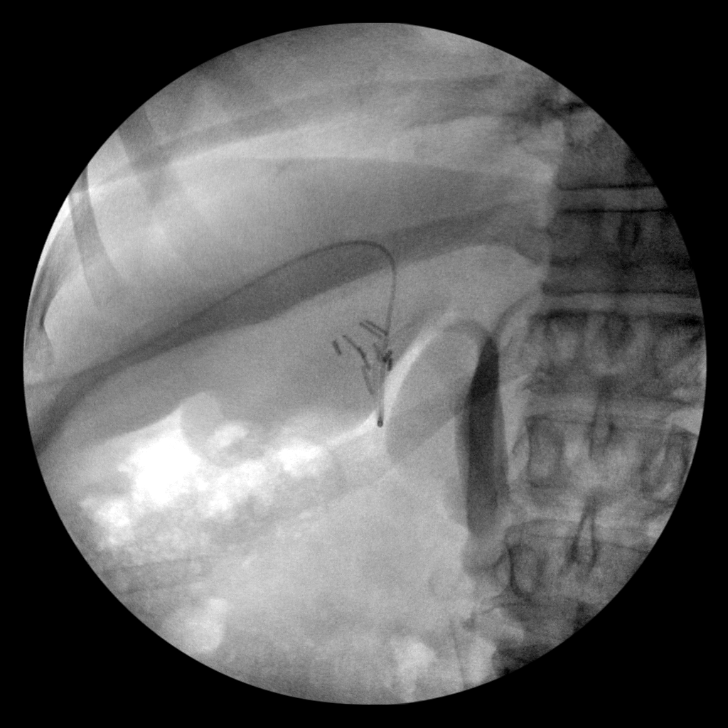
[im 1/2]
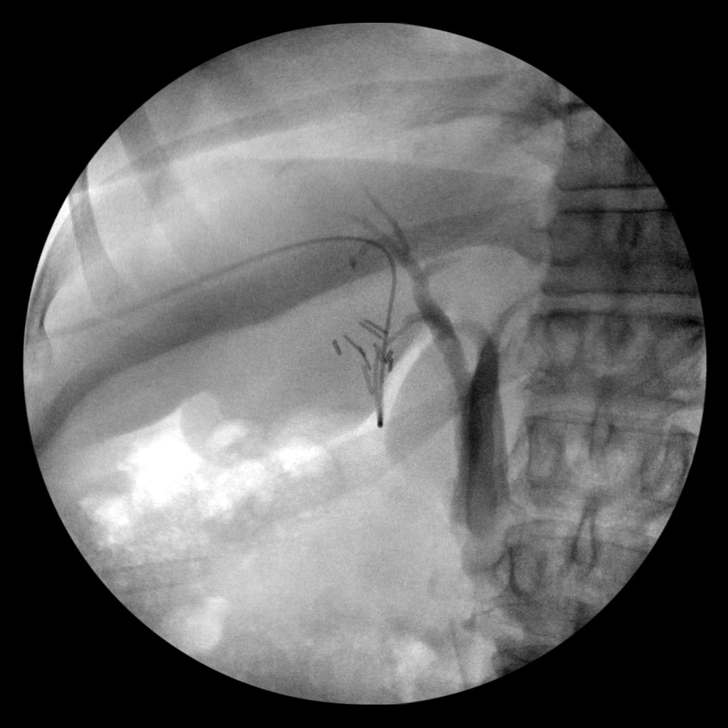
[im 1/2]
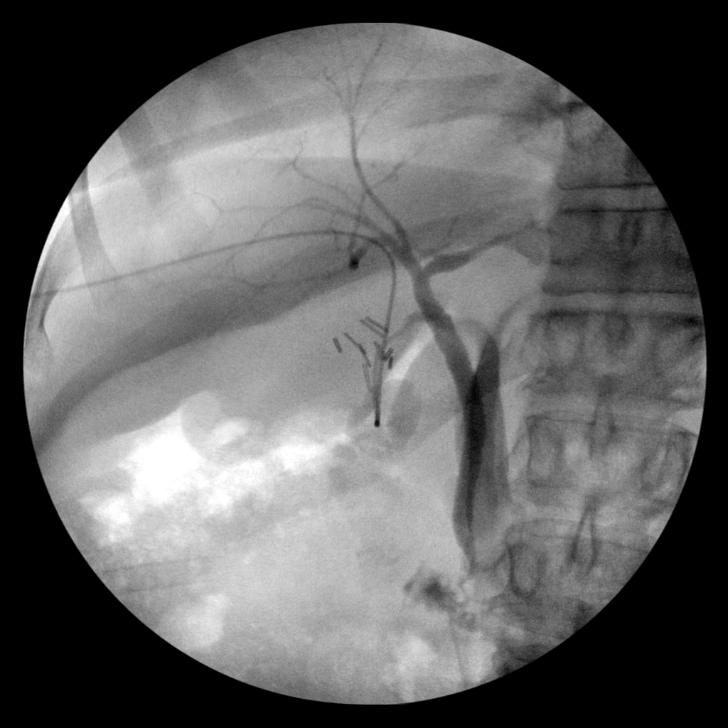
[im 1/2]
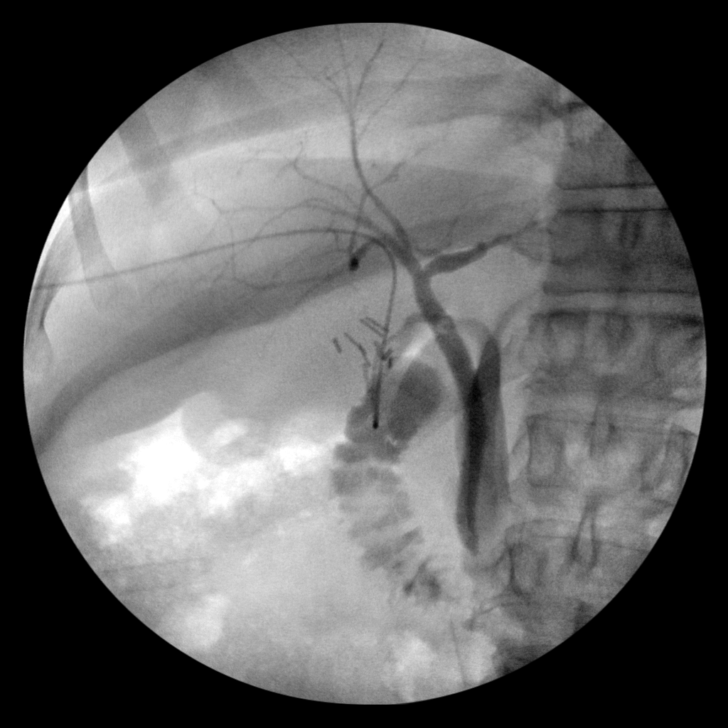
[im 2/2]
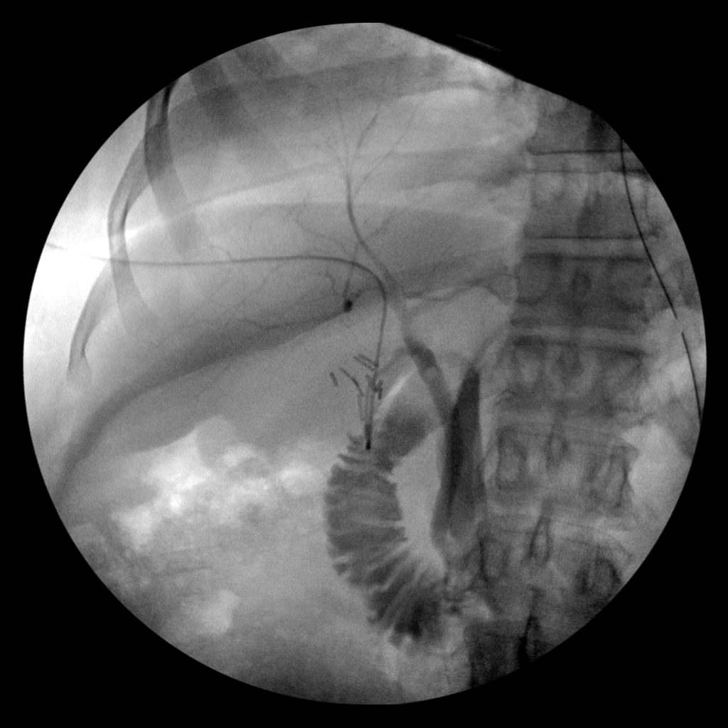
[im 2/2]
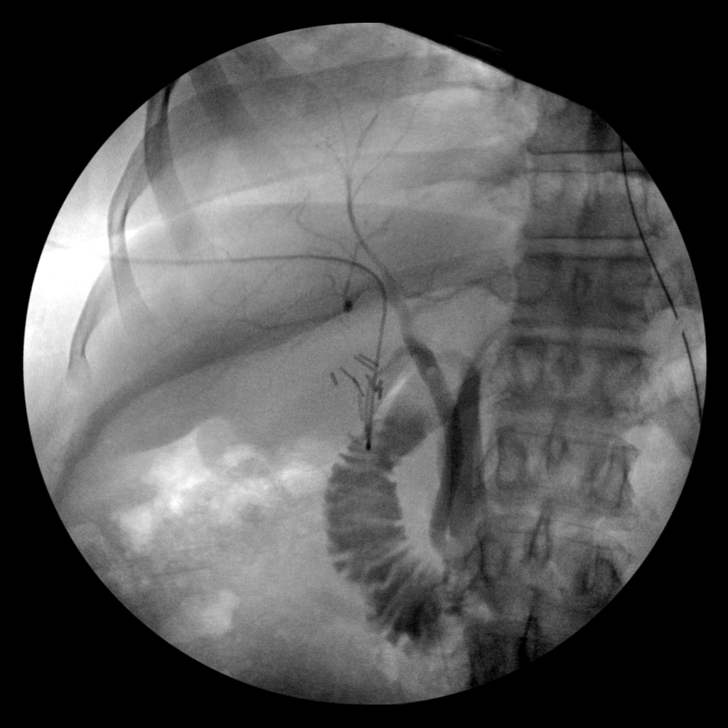
[im 2/2]
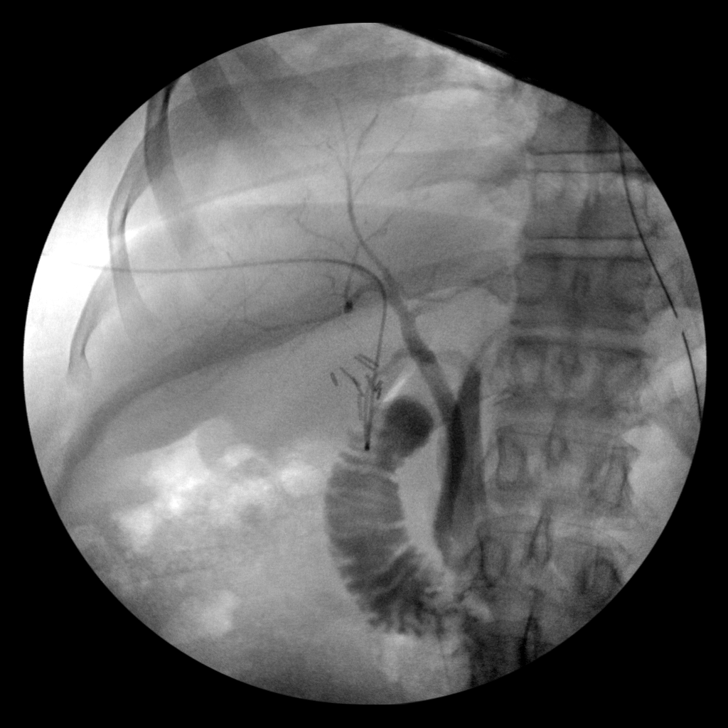
[im 2/2]
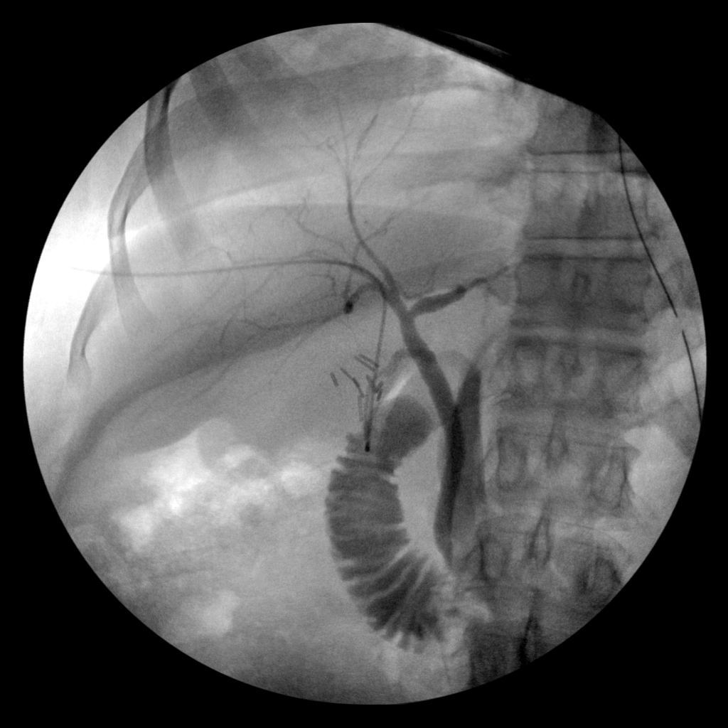

[8 of 8 positions shown; findings below may reference images not displayed]

FINDINGS: Intraoperative cholangiographic images of the right upper abdominal
quadrant during laparoscopic cholecystectomy are provided for
review.

Surgical clips overlie the expected location of the gallbladder
fossa.

Contrast injection demonstrates selective cannulation of the central
aspect of the cystic duct.

There is passage of contrast through the central aspect of the
cystic duct with filling of a non dilated common bile duct. There is
passage of contrast though the CBD and into the descending portion
of the duodenum.

There is minimal reflux of injected contrast into the common hepatic
duct and central aspect of the non dilated intrahepatic biliary
system.

Transient nonocclusive filling defect within the mid and central
aspect of the common bile duct is favored to represent an air
bubble. No definitive persistent filling defects within the
opacified portions of the biliary system to suggest the presence of
choledocholithiasis.
IMPRESSION: No evidence of choledocholithiasis.
